# Patient Record
Sex: Female | Born: 1935 | Race: Black or African American | Hispanic: No | Smoking: Never smoker
Health system: Southern US, Community
[De-identification: ages and names within clinical notes are randomized; demographics above are authoritative.]

## PROBLEM LIST (undated history)

## (undated) DIAGNOSIS — E7800 Pure hypercholesterolemia, unspecified: Secondary | ICD-10-CM

## (undated) DIAGNOSIS — I10 Essential (primary) hypertension: Secondary | ICD-10-CM

## (undated) DIAGNOSIS — R430 Anosmia: Secondary | ICD-10-CM

## (undated) DIAGNOSIS — K219 Gastro-esophageal reflux disease without esophagitis: Secondary | ICD-10-CM

## (undated) DIAGNOSIS — C50919 Malignant neoplasm of unspecified site of unspecified female breast: Secondary | ICD-10-CM

## (undated) HISTORY — PX: CHOLECYSTECTOMY: SHX55

## (undated) HISTORY — PX: COLONOSCOPY: SHX174

## (undated) HISTORY — PX: EXCISION / BIOPSY BREAST / NIPPLE / DUCT: SUR469

## (undated) HISTORY — DX: Pure hypercholesterolemia, unspecified: E78.00

## (undated) HISTORY — DX: Essential (primary) hypertension: I10

## (undated) HISTORY — DX: Gastro-esophageal reflux disease without esophagitis: K21.9

## (undated) HISTORY — PX: APPENDECTOMY: SHX54

## (undated) HISTORY — PX: CATARACT EXTRACTION: SUR2

## (undated) HISTORY — DX: Anosmia: R43.0

---

## 1997-03-27 HISTORY — PX: ABDOMINAL HYSTERECTOMY: SHX81

## 2012-02-03 ENCOUNTER — Other Ambulatory Visit: Payer: Self-pay | Admitting: Internal Medicine

## 2012-02-03 DIAGNOSIS — R928 Other abnormal and inconclusive findings on diagnostic imaging of breast: Secondary | ICD-10-CM

## 2012-02-13 ENCOUNTER — Other Ambulatory Visit: Payer: Self-pay | Admitting: Internal Medicine

## 2012-02-13 ENCOUNTER — Ambulatory Visit
Admission: RE | Admit: 2012-02-13 | Discharge: 2012-02-13 | Disposition: A | Discharge status: Home / Self Care | Payer: Medicare Other | Source: Ambulatory Visit | Attending: Internal Medicine | Admitting: Internal Medicine

## 2012-02-13 ENCOUNTER — Other Ambulatory Visit: Payer: Self-pay | Admitting: Diagnostic Radiology

## 2012-02-13 DIAGNOSIS — R928 Other abnormal and inconclusive findings on diagnostic imaging of breast: Secondary | ICD-10-CM

## 2012-02-13 HISTORY — PX: OTHER SURGICAL HISTORY: SHX169

## 2012-02-17 ENCOUNTER — Other Ambulatory Visit: Payer: Self-pay | Admitting: Internal Medicine

## 2012-02-17 DIAGNOSIS — N63 Unspecified lump in unspecified breast: Secondary | ICD-10-CM

## 2012-02-24 ENCOUNTER — Other Ambulatory Visit: Payer: Medicare Other

## 2012-02-25 HISTORY — PX: BREAST LUMPECTOMY: SHX2

## 2012-03-01 ENCOUNTER — Telehealth (INDEPENDENT_AMBULATORY_CARE_PROVIDER_SITE_OTHER): Payer: Self-pay | Admitting: General Surgery

## 2012-03-01 ENCOUNTER — Other Ambulatory Visit (INDEPENDENT_AMBULATORY_CARE_PROVIDER_SITE_OTHER): Payer: Self-pay | Admitting: General Surgery

## 2012-03-01 ENCOUNTER — Ambulatory Visit (INDEPENDENT_AMBULATORY_CARE_PROVIDER_SITE_OTHER): Payer: Medicare Other | Admitting: General Surgery

## 2012-03-01 ENCOUNTER — Encounter (INDEPENDENT_AMBULATORY_CARE_PROVIDER_SITE_OTHER): Payer: Self-pay | Admitting: General Surgery

## 2012-03-01 VITALS — BP 150/80 | HR 76 | Temp 96.2°F | Resp 18 | Ht 62.5 in | Wt 144.6 lb

## 2012-03-01 DIAGNOSIS — C50419 Malignant neoplasm of upper-outer quadrant of unspecified female breast: Secondary | ICD-10-CM

## 2012-03-01 NOTE — Telephone Encounter (Signed)
Called and left message for Alexandra Sloan to return call. Trying to determine if this patient is in process of having her MRI breast bilateral w/wo contrast scheduled. It appears that Dr. Nedra Hai ordered the test on 02/17/12 and cancelled it on 02/19/12 pending surgical consult. The MRI should have been performed prior to appointment. Trying to determine status of appointment if any.

## 2012-03-01 NOTE — Patient Instructions (Signed)
Your recent breast x-rays and biopsies show an area of non-invasive breast cancer in the upper outer quadrant of the right breast, near the nipple. This is also called DCIS.  We have discussed treatment options for this.  You have decided that you would like to keep your breast, so you will be scheduled for a right partial mastectomy with needle localization in the near future.  Several days before that surgery, we will get an MRI of the breast to make sure that we do not see a second area of cancer. If we do find a second area we will cancel the surgery and reassess what the next steps are.     Lumpectomy, Breast Conserving Surgery A lumpectomy is breast surgery that removes only part of the breast. Another name used may be partial mastectomy. The amount removed varies. Make sure you understand how much of your breast will be removed. Reasons for a lumpectomy:  Any solid breast mass.  Grouped significant nodularity that may be confused with a solitary breast mass. Lumpectomy is the most common form of breast cancer surgery today. The surgeon removes the portion of your breast which contains the tumor (cancer). This is the lump. Some normal tissue around the lump is also removed to be sure that all the tumor has been removed.  If cancer cells are found in the margins where the breast tissue was removed, your surgeon will do more surgery to remove the remaining cancer tissue. This is called re-excision surgery. Radiation and/or chemotherapy treatments are often given following a lumpectomy to kill any cancer cells that could possibly remain.  REASONS YOU MAY NOT BE ABLE TO HAVE BREAST CONSERVING SURGERY:  The tumor is located in more than one place.  Your breast is small and the tumor is large so the breast would be disfigured.  The entire tumor removal is not successful with a lumpectomy.  You cannot commit to a full course of chemotherapy, radiation therapy or are pregnant and cannot  have radiation.  You have previously had radiation to the breast to treat cancer. HOW A LUMPECTOMY IS PERFORMED If overnight nursing is not required following a biopsy, a lumpectomy can be performed as a same-day surgery. This can be done in a hospital, clinic, or surgical center. The anesthesia used will depend on your surgeon. They will discuss this with you. A general anesthetic keeps you sleeping through the procedure. LET YOUR CAREGIVERS KNOW ABOUT THE FOLLOWING:  Allergies  Medications taken including herbs, eye drops, over the counter medications, and creams.  Use of steroids (by mouth or creams)  Previous problems with anesthetics or Novocaine.  Possibility of pregnancy, if this applies  History of blood clots (thrombophlebitis)  History of bleeding or blood problems.  Previous surgery  Other health problems BEFORE THE PROCEDURE You should be present one hour prior to your procedure unless directed otherwise.  AFTER THE PROCEDURE  After surgery, you will be taken to the recovery area where a nurse will watch and check your progress. Once you're awake, stable, and taking fluids well, barring other problems you will be allowed to go home.  Ice packs applied to your operative site may help with discomfort and keep the swelling down.  A small rubber drain may be placed in the breast for a couple of days to prevent a hematoma from developing in the breast.  A pressure dressing may be applied for 24 to 48 hours to prevent bleeding.  Keep the wound dry.  You  may resume a normal diet and activities as directed. Avoid strenuous activities affecting the arm on the side of the biopsy site such as tennis, swimming, heavy lifting (more than 10 pounds) or pulling.  Bruising in the breast is normal following this procedure.  Wearing a bra - even to bed - may be more comfortable and also help keep the dressing on.  Change dressings as directed.  Only take over-the-counter or  prescription medicines for pain, discomfort, or fever as directed by your caregiver. Call for your results as instructed by your surgeon. Remember it is your responsibility to get the results of your lumpectomy if your surgeon asked you to follow-up. Do not assume everything is fine if you have not heard from your caregiver. SEEK MEDICAL CARE IF:   There is increased bleeding (more than a small spot) from the wound.  You notice redness, swelling, or increasing pain in the wound.  Pus is coming from wound.  An unexplained oral temperature above 102 F (38.9 C) develops.  You notice a foul smell coming from the wound or dressing. SEEK IMMEDIATE MEDICAL CARE IF:   You develop a rash.  You have difficulty breathing.  You have any allergic problems. Document Released: 03/24/2006 Document Revised: 05/05/2011 Document Reviewed: 06/25/2006 [facility] Patient Information 2013 [place], [place].

## 2012-03-01 NOTE — Telephone Encounter (Signed)
Called and spoke with Marcelino Duster in the [facility] medical records department. Advised of need for the test results from 01/09/12 and 01/29/12. Send fax cover sheet to [phone] to request information to be sent asap. Confirmation of fax received. Records faxed by Marcelino Duster given to Dr. Derrell Lolling for review and will be sent to medical records to be scanned into the chart.

## 2012-03-01 NOTE — Progress Notes (Signed)
Patient ID: Alexandra Sloan, female   DOB: 08/14/1934, 78 y.o.   MRN: [id number]  Chief Complaint  Patient presents with  . New Evaluation    eval ductal carcinoma    HPI Alexandra Sloan is a 78 y.o. female.  She is referred by Dr. Lanora Manis needle at the breast center of [facility] for evaluation and management of a newly diagnosed area of DCIS in the upper outer quadrant of the right breast. Her primary care physician is Dr. [name] in [place].  This patient lives in [place]. She is in fairly good health. She has never had a breast problem before. She went for a routine screening mammograms. She thinks has been 8 years since her last mammograms. Bilateral diagnostic mammograms and ultrasound were done in [place] and read by Dr. Sharin Mons. They show a 0.9 cm. mass with irregular margins at the 10:30 position of the right breast, 2 cm from the nipple. They also show a benign cyst at the 4:00 position of the left breast 7 cm from the nipple.  Image guided biopsy of the right breast mass shows low-grade DCIS within a papilloma. ER 100%, PR 100%. MRI is pending.  Family history reveals breast cancer in her sister in her 83s. She is deceased of unknown cause. There is no other breast cancer or ovarian cancer in the family.  Comorbidities include controlled hypertension, hyperlipidemia, GERD, history of hysterectomy, cataract, and cholecystectomy. She is independent and in stable health. Her daughter is with her today. HPI  Past Medical History  Diagnosis Date  . Pure hypercholesterolemia   . Benign essential hypertension   . Esophageal reflux   . Anosmia   . Cancer     Past Surgical History  Procedure Date  . Cataract extraction   . Abdominal hysterectomy 03/1997    Family History  Problem Relation Age of Onset  . Cancer Sister     breast    Social History History  Substance Use Topics  . Smoking status: Never Smoker   . Smokeless tobacco: Never Used  .  Alcohol Use: No    No Known Allergies  Current Outpatient Prescriptions  Medication Sig Dispense Refill  . atenolol-chlorthalidone (TENORETIC) 50-25 MG per tablet Daily. Take 1/2 oral daily        Review of Systems Review of Systems  Constitutional: Negative for fever, chills and unexpected weight change.  HENT: Negative for hearing loss, congestion, sore throat, trouble swallowing and voice change.   Eyes: Negative for visual disturbance.  Respiratory: Negative for cough and wheezing.   Cardiovascular: Negative for chest pain, palpitations and leg swelling.  Gastrointestinal: Negative for nausea, vomiting, abdominal pain, diarrhea, constipation, blood in stool, abdominal distention and anal bleeding.  Genitourinary: Negative for hematuria, vaginal bleeding and difficulty urinating.  Musculoskeletal: Negative for arthralgias.  Skin: Negative for rash and wound.  Neurological: Negative for seizures, syncope and headaches.  Hematological: Negative for adenopathy. Does not bruise/bleed easily.  Psychiatric/Behavioral: Negative for confusion.    Blood pressure 150/80, pulse 76, temperature 96.2 F (35.7 C), temperature source Temporal, resp. rate 18, height 5' 2.5" (1.588 m), weight 144 lb 9.6 oz (65.59 kg).  Physical Exam Physical Exam  Constitutional: She is oriented to person, place, and time. She appears well-developed and well-nourished. No distress.  HENT:  Head: Normocephalic and atraumatic.  Nose: Nose normal.  Mouth/Throat: No oropharyngeal exudate.  Eyes: Conjunctivae normal and EOM are normal. Pupils are equal, round, and reactive to light. Left  eye exhibits no discharge. No scleral icterus.  Neck: Neck supple. No JVD present. No tracheal deviation present. No thyromegaly present.  Cardiovascular: Normal rate, regular rhythm, normal heart sounds and intact distal pulses.   No murmur heard. Pulmonary/Chest: Effort normal and breath sounds normal. No respiratory distress.  She has no wheezes. She has no rales. She exhibits no tenderness.       Breasts are medium size, recent biopsy site on right. No palpable mass. No other skin change. No  axillary adenopathy. Significant keloid transversely in presternal area.  Abdominal: Soft. Bowel sounds are normal. She exhibits no distension and no mass. There is no tenderness. There is no rebound and no guarding.       Scars from laparoscopic cholecystectomy. Lower midline scar. These scars have uneventful healing  Musculoskeletal: She exhibits no edema and no tenderness.  Lymphadenopathy:    She has no cervical adenopathy.  Neurological: She is alert and oriented to person, place, and time. She exhibits normal muscle tone. Coordination normal.  Skin: Skin is warm. No rash noted. She is not diaphoretic. No erythema. No pallor.  Psychiatric: She has a normal mood and affect. Her behavior is normal. Judgment and thought content normal.    Data Reviewed I have reviewed the imaging studies, the pathology report, the breast diagnostic profile, and office notes from [place].  Assessment    Receptor-positive DCIS right breast, upper outer quadrant, 9 mm diameter, periareolar within a papilloma.  The patient is interested in breast conservation   hypertension  Hyperlipidemia  GERD  History of cholecystectomy and hysterectomy.    Plan    I had a very long talk with the patient and her daughter. We talked about mastectomy, lymph node biopsy, lumpectomy, radiation therapy, antiestrogen therapy, MRI. At the end of the conversation it was clear that she is in favor of breast conservation and is willing to consider antiestrogen therapy and radiation therapy postop. She was offered referral for second opinion and she declined that.  We will tentatively schedule her for a right partial mastectomy with needle localization the near future.  We will schedule her for MRI preop since this is pure DCIS.  I have discussed the  indications, details, technique, and numerous risks of the surgery with her. She understands these issues. Her questions were answered. She agrees with this plan.  If the MRI shows multifocal disease we will cancel  her lumpectomy surgery and reconsider treatment plan. She is aware of this, but requested that we go ahead and tentatively schedule surgery.       Angelia Mould. Derrell Lolling, M.D., [facility] General and Minimally invasive Surgery Breast and Colorectal Surgery Office:   [phone] Pager:   [phone]  03/01/2012, 4:39 PM

## 2012-03-03 ENCOUNTER — Encounter (INDEPENDENT_AMBULATORY_CARE_PROVIDER_SITE_OTHER): Payer: Self-pay

## 2012-03-03 ENCOUNTER — Other Ambulatory Visit (INDEPENDENT_AMBULATORY_CARE_PROVIDER_SITE_OTHER): Payer: Self-pay | Admitting: General Surgery

## 2012-03-03 DIAGNOSIS — C50911 Malignant neoplasm of unspecified site of right female breast: Secondary | ICD-10-CM

## 2012-03-08 ENCOUNTER — Ambulatory Visit
Admission: RE | Admit: 2012-03-08 | Discharge: 2012-03-08 | Disposition: A | Discharge status: Home / Self Care | Payer: Medicare Other | Source: Ambulatory Visit | Attending: General Surgery | Admitting: General Surgery

## 2012-03-08 MED ORDER — GADOBENATE DIMEGLUMINE 529 MG/ML IV SOLN
13.0000 mL | Freq: Once | INTRAVENOUS | Status: AC | PRN
  Administered 2012-03-08: 13 mL via INTRAVENOUS

## 2012-03-15 ENCOUNTER — Encounter (HOSPITAL_BASED_OUTPATIENT_CLINIC_OR_DEPARTMENT_OTHER): Payer: Self-pay | Admitting: *Deleted

## 2012-03-15 ENCOUNTER — Encounter (HOSPITAL_BASED_OUTPATIENT_CLINIC_OR_DEPARTMENT_OTHER)
Admission: RE | Admit: 2012-03-15 | Discharge: 2012-03-15 | Disposition: A | Discharge status: Home / Self Care | Payer: Medicare Other | Source: Ambulatory Visit | Attending: General Surgery | Admitting: General Surgery

## 2012-03-15 ENCOUNTER — Other Ambulatory Visit: Payer: Self-pay

## 2012-03-15 LAB — BASIC METABOLIC PANEL
BUN: 18 mg/dL (ref 6–23)
CO2: 27 mEq/L (ref 19–32)
Calcium: 10.1 mg/dL (ref 8.4–10.5)
Chloride: 100 mEq/L (ref 96–112)
Creatinine, Ser: 1 mg/dL (ref 0.50–1.10)
GFR calc Af Amer: 62 mL/min — ABNORMAL LOW (ref >90)

## 2012-03-15 NOTE — Progress Notes (Signed)
To come in for [id number]  

## 2012-03-16 NOTE — H&P (Signed)
Alexandra Sloan     MRN: [id number]   Description: 78 year old female  Provider: Ernestene Mention, MD  Department: [facility]       Diagnoses     Cancer of upper-outer quadrant of female breast   - Primary    174.4         Vitals   BP Pulse Temp Resp Ht Wt    150/80 76 96.2 F (35.7 C) (Temporal) 18 5' 2.5" (1.588 m) 144 lb 9.6 oz (65.59 kg)    BMI - 26.03 kg/m2                 History and Physical     Ernestene Mention, MD   Patient ID: Alexandra Sloan, female   DOB: 1934/04/01, 78 y.o.   MRN: [id number]              HPI Alexandra Sloan is a 78 y.o. female.  She is referred by Dr. Lanora Manis Sloan at the breast center of [facility] for evaluation and management of a newly diagnosed area of DCIS in the upper outer quadrant of the right breast. Her primary care physician is Dr. [name] in [place].   This patient lives in [place]. She is in fairly good health. She has never had a breast problem before. She went for a routine screening mammograms. She thinks has been 8 years since her last mammograms. Bilateral diagnostic mammograms and ultrasound were done in [place] and read by Dr. Sharin Mons. They show a 0.9 cm. mass with irregular margins at the 10:30 position of the right breast, 2 cm from the nipple. They also show a benign cyst at the 4:00 position of the left breast 7 cm from the nipple.   Image guided biopsy of the right breast mass shows low-grade DCIS within a papilloma. ER 100%, PR 100%. MRI is pending.   Family history reveals breast cancer in her sister in her 17s. She is deceased of unknown cause. There is no other breast cancer or ovarian cancer in the family.   Comorbidities include controlled hypertension, hyperlipidemia, GERD, history of hysterectomy, cataract, and cholecystectomy. She is independent and in stable health. Her daughter is with her today.       Past Medical History   Diagnosis  Date   .  Pure  hypercholesterolemia     .  Benign essential hypertension     .  Esophageal reflux     .  Anosmia     .  Cancer         Past Surgical History   Procedure  Date   .  Cataract extraction     .  Abdominal hysterectomy  03/1997       Family History   Problem  Relation  Age of Onset   .  Cancer  Sister         breast      Social History History   Substance Use Topics   .  Smoking status:  Never Smoker    .  Smokeless tobacco:  Never Used   .  Alcohol Use:  No      No Known Allergies    Current Outpatient Prescriptions   Medication  Sig  Dispense  Refill   .  atenolol-chlorthalidone (TENORETIC) 50-25 MG per tablet  Daily. Take 1/2 oral daily            Review of Systems   Constitutional:  Negative for fever, chills and unexpected weight change.  HENT: Negative for hearing loss, congestion, sore throat, trouble swallowing and voice change.   Eyes: Negative for visual disturbance.  Respiratory: Negative for cough and wheezing.   Cardiovascular: Negative for chest pain, palpitations and leg swelling.  Gastrointestinal: Negative for nausea, vomiting, abdominal pain, diarrhea, constipation, blood in stool, abdominal distention and anal bleeding.  Genitourinary: Negative for hematuria, vaginal bleeding and difficulty urinating.  Musculoskeletal: Negative for arthralgias.  Skin: Negative for rash and wound.  Neurological: Negative for seizures, syncope and headaches.  Hematological: Negative for adenopathy. Does not bruise/bleed easily.  Psychiatric/Behavioral: Negative for confusion.    Blood pressure 150/80, pulse 76, temperature 96.2 F (35.7 C), temperature source Temporal, resp. rate 18, height 5' 2.5" (1.588 m), weight 144 lb 9.6 oz (65.59 kg).   Physical Exam   Constitutional: She is oriented to person, place, and time. She appears well-developed and well-nourished. No distress.  HENT:   Head: Normocephalic and atraumatic.   Nose: Nose normal.   Mouth/Throat:  No oropharyngeal exudate.  Eyes: Conjunctivae normal and EOM are normal. Pupils are equal, round, and reactive to light. Left eye exhibits no discharge. No scleral icterus.  Neck: Neck supple. No JVD present. No tracheal deviation present. No thyromegaly present.  Cardiovascular: Normal rate, regular rhythm, normal heart sounds and intact distal pulses.    No murmur heard. Pulmonary/Chest: Effort normal and breath sounds normal. No respiratory distress. She has no wheezes. She has no rales. She exhibits no tenderness.       Breasts are medium size, recent biopsy site on right. No palpable mass. No other skin change. No  axillary adenopathy. Significant keloid transversely in presternal area.  Abdominal: Soft. Bowel sounds are normal. She exhibits no distension and no mass. There is no tenderness. There is no rebound and no guarding.       Scars from laparoscopic cholecystectomy. Lower midline scar. These scars have uneventful healing  Musculoskeletal: She exhibits no edema and no tenderness.  Lymphadenopathy:    She has no cervical adenopathy.  Neurological: She is alert and oriented to person, place, and time. She exhibits normal muscle tone. Coordination normal.  Skin: Skin is warm. No rash noted. She is not diaphoretic. No erythema. No pallor.  Psychiatric: She has a normal mood and affect. Her behavior is normal. Judgment and thought content normal.    Data Reviewed I have reviewed the imaging studies, the pathology report, the breast diagnostic profile, and office notes from [place].   Assessment Receptor-positive DCIS right breast, upper outer quadrant, 9 mm diameter, periareolar within a papilloma.   The patient is interested in breast conservation    hypertension   Hyperlipidemia   GERD   History of cholecystectomy and hysterectomy.   Plan I had a very long talk with the patient and her daughter. We talked about mastectomy, lymph node biopsy, lumpectomy, radiation  therapy, antiestrogen therapy, MRI. At the end of the conversation it was clear that she is in favor of breast conservation and is willing to consider antiestrogen therapy and radiation therapy postop. She was offered referral for second opinion and she declined that.   We will tentatively schedule her for a right partial mastectomy with Sloan localization the near future.   We will schedule her for MRI preop since this is pure DCIS.   I have discussed the indications, details, technique, and numerous risks of the surgery with her. She understands these issues. Her questions were answered. She  agrees with this plan.   If the MRI shows multifocal disease we will cancel  her lumpectomy surgery and reconsider treatment plan. She is aware of this, but requested that we go ahead and tentatively schedule surgery.  ADDENDUM:  MRI shows solitary area of enhancemenmt, some biopsy effect makes it look larger(3.4 cm).       Angelia Mould. Derrell Lolling, M.D., [facility] General and Minimally invasive Surgery Breast and Colorectal Surgery Office:   [phone] Pager:   [phone]

## 2012-03-18 ENCOUNTER — Ambulatory Visit
Admission: RE | Admit: 2012-03-18 | Discharge: 2012-03-18 | Disposition: A | Discharge status: Home / Self Care | Payer: Medicare Other | Source: Ambulatory Visit | Attending: General Surgery | Admitting: General Surgery

## 2012-03-18 ENCOUNTER — Ambulatory Visit (HOSPITAL_BASED_OUTPATIENT_CLINIC_OR_DEPARTMENT_OTHER)
Admission: RE | Admit: 2012-03-18 | Discharge: 2012-03-18 | Disposition: A | Discharge status: Home / Self Care | Payer: Medicare Other | Source: Ambulatory Visit | Attending: General Surgery | Admitting: General Surgery

## 2012-03-18 ENCOUNTER — Ambulatory Visit (HOSPITAL_BASED_OUTPATIENT_CLINIC_OR_DEPARTMENT_OTHER): Payer: Medicare Other | Admitting: *Deleted

## 2012-03-18 ENCOUNTER — Encounter (HOSPITAL_BASED_OUTPATIENT_CLINIC_OR_DEPARTMENT_OTHER): Payer: Self-pay | Admitting: *Deleted

## 2012-03-18 ENCOUNTER — Other Ambulatory Visit (INDEPENDENT_AMBULATORY_CARE_PROVIDER_SITE_OTHER): Payer: Self-pay | Admitting: General Surgery

## 2012-03-18 ENCOUNTER — Encounter (HOSPITAL_BASED_OUTPATIENT_CLINIC_OR_DEPARTMENT_OTHER)
Admission: RE | Disposition: A | Discharge status: Home / Self Care | Payer: Self-pay | Source: Ambulatory Visit | Attending: General Surgery

## 2012-03-18 DIAGNOSIS — K219 Gastro-esophageal reflux disease without esophagitis: Secondary | ICD-10-CM | POA: Insufficient documentation

## 2012-03-18 DIAGNOSIS — C50919 Malignant neoplasm of unspecified site of unspecified female breast: Secondary | ICD-10-CM

## 2012-03-18 DIAGNOSIS — I10 Essential (primary) hypertension: Secondary | ICD-10-CM | POA: Insufficient documentation

## 2012-03-18 DIAGNOSIS — D0590 Unspecified type of carcinoma in situ of unspecified breast: Secondary | ICD-10-CM | POA: Insufficient documentation

## 2012-03-18 DIAGNOSIS — Z01810 Encounter for preprocedural cardiovascular examination: Secondary | ICD-10-CM | POA: Insufficient documentation

## 2012-03-18 DIAGNOSIS — C50419 Malignant neoplasm of upper-outer quadrant of unspecified female breast: Secondary | ICD-10-CM

## 2012-03-18 DIAGNOSIS — Z01812 Encounter for preprocedural laboratory examination: Secondary | ICD-10-CM | POA: Insufficient documentation

## 2012-03-18 DIAGNOSIS — C50911 Malignant neoplasm of unspecified site of right female breast: Secondary | ICD-10-CM

## 2012-03-18 DIAGNOSIS — E785 Hyperlipidemia, unspecified: Secondary | ICD-10-CM | POA: Insufficient documentation

## 2012-03-18 HISTORY — PX: PARTIAL MASTECTOMY WITH NEEDLE LOCALIZATION: SHX6008

## 2012-03-18 HISTORY — DX: Malignant neoplasm of unspecified site of unspecified female breast: C50.919

## 2012-03-18 SURGERY — PARTIAL MASTECTOMY WITH NEEDLE LOCALIZATION
Anesthesia: General | Site: Breast | Laterality: Right | Wound class: Clean

## 2012-03-18 MED ORDER — SODIUM CHLORIDE 0.9 % IJ SOLN: 3.0000 mL | INTRAMUSCULAR | Status: DC | PRN

## 2012-03-18 MED ORDER — ACETAMINOPHEN 650 MG RE SUPP: 650.0000 mg | RECTAL | Status: DC | PRN

## 2012-03-18 MED ORDER — SODIUM CHLORIDE 0.9 % IJ SOLN: 3.0000 mL | Freq: Two times a day (BID) | INTRAMUSCULAR | Status: DC

## 2012-03-18 MED ORDER — PROPOFOL 10 MG/ML IV BOLUS
INTRAVENOUS | Status: DC | PRN
  Administered 2012-03-18: 130 mg via INTRAVENOUS

## 2012-03-18 MED ORDER — OXYCODONE HCL 5 MG PO TABS: 5.0000 mg | ORAL_TABLET | ORAL | Status: DC | PRN

## 2012-03-18 MED ORDER — CHLORHEXIDINE GLUCONATE 4 % EX LIQD: 1.0000 "application " | Freq: Once | CUTANEOUS | Status: DC

## 2012-03-18 MED ORDER — ONDANSETRON HCL 4 MG/2ML IJ SOLN
INTRAMUSCULAR | Status: DC | PRN
  Administered 2012-03-18: 4 mg via INTRAVENOUS

## 2012-03-18 MED ORDER — BUPIVACAINE-EPINEPHRINE 0.5% -1:200000 IJ SOLN
INTRAMUSCULAR | Status: DC | PRN
  Administered 2012-03-18: 10 mL

## 2012-03-18 MED ORDER — MORPHINE SULFATE 2 MG/ML IJ SOLN: 2.0000 mg | INTRAMUSCULAR | Status: DC | PRN

## 2012-03-18 MED ORDER — LIDOCAINE-EPINEPHRINE (PF) 1 %-1:200000 IJ SOLN
INTRAMUSCULAR | Status: DC | PRN
  Administered 2012-03-18: 10 mL

## 2012-03-18 MED ORDER — ACETAMINOPHEN 325 MG PO TABS: 650.0000 mg | ORAL_TABLET | ORAL | Status: DC | PRN

## 2012-03-18 MED ORDER — CEFAZOLIN SODIUM-DEXTROSE 2-3 GM-% IV SOLR
2.0000 g | INTRAVENOUS | Status: AC
  Administered 2012-03-18: 2 g via INTRAVENOUS

## 2012-03-18 MED ORDER — EPHEDRINE SULFATE 50 MG/ML IJ SOLN
INTRAMUSCULAR | Status: DC | PRN
  Administered 2012-03-18 (×2): 10 mg via INTRAVENOUS

## 2012-03-18 MED ORDER — SODIUM CHLORIDE 0.9 % IV SOLN: 250.0000 mL | INTRAVENOUS | Status: DC | PRN

## 2012-03-18 MED ORDER — OXYCODONE HCL 5 MG/5ML PO SOLN: 5.0000 mg | Freq: Once | ORAL | Status: AC | PRN

## 2012-03-18 MED ORDER — LACTATED RINGERS IV SOLN
INTRAVENOUS | Status: DC
  Administered 2012-03-18: 12:00:00 via INTRAVENOUS

## 2012-03-18 MED ORDER — HYDROCODONE-ACETAMINOPHEN 5-325 MG PO TABS: 1.0000 | ORAL_TABLET | ORAL | Status: DC | PRN

## 2012-03-18 MED ORDER — ONDANSETRON HCL 4 MG/2ML IJ SOLN: 4.0000 mg | Freq: Four times a day (QID) | INTRAMUSCULAR | Status: DC | PRN

## 2012-03-18 MED ORDER — KETOROLAC TROMETHAMINE 15 MG/ML IJ SOLN
INTRAMUSCULAR | Status: DC | PRN
  Administered 2012-03-18: 15 mg via INTRAVENOUS

## 2012-03-18 MED ORDER — HYDROMORPHONE HCL PF 1 MG/ML IJ SOLN: 0.2500 mg | INTRAMUSCULAR | Status: DC | PRN

## 2012-03-18 MED ORDER — LIDOCAINE HCL (CARDIAC) 20 MG/ML IV SOLN
INTRAVENOUS | Status: DC | PRN
  Administered 2012-03-18: 50 mg via INTRAVENOUS

## 2012-03-18 MED ORDER — ONDANSETRON HCL 4 MG/2ML IJ SOLN: 4.0000 mg | Freq: Once | INTRAMUSCULAR | Status: DC | PRN

## 2012-03-18 MED ORDER — SODIUM CHLORIDE 0.9 % IV SOLN: INTRAVENOUS | Status: DC

## 2012-03-18 MED ORDER — FENTANYL CITRATE 0.05 MG/ML IJ SOLN
INTRAMUSCULAR | Status: DC | PRN
  Administered 2012-03-18 (×2): 50 ug via INTRAVENOUS

## 2012-03-18 MED ORDER — OXYCODONE HCL 5 MG PO TABS
5.0000 mg | ORAL_TABLET | Freq: Once | ORAL | Status: AC | PRN
  Administered 2012-03-18: 5 mg via ORAL

## 2012-03-18 SURGICAL SUPPLY — 51 items
APPLIER CLIP 9.375 MED OPEN (MISCELLANEOUS) ×2
BANDAGE ELASTIC 6 VELCRO ST LF (GAUZE/BANDAGES/DRESSINGS) IMPLANT
BENZOIN TINCTURE PRP APPL 2/3 (GAUZE/BANDAGES/DRESSINGS) IMPLANT
BINDER BREAST LRG (GAUZE/BANDAGES/DRESSINGS) ×2 IMPLANT
BLADE SURG 15 STRL LF DISP TIS (BLADE) ×2 IMPLANT
BLADE SURG 15 STRL SS (BLADE) ×2
CANISTER SUCTION 1200CC (MISCELLANEOUS) ×2 IMPLANT
CHLORAPREP W/TINT 26ML (MISCELLANEOUS) ×2 IMPLANT
CLIP APPLIE 9.375 MED OPEN (MISCELLANEOUS) ×1 IMPLANT
CLOTH BEACON ORANGE TIMEOUT ST (SAFETY) ×2 IMPLANT
COVER MAYO STAND STRL (DRAPES) ×2 IMPLANT
COVER TABLE BACK 60X90 (DRAPES) ×2 IMPLANT
DECANTER SPIKE VIAL GLASS SM (MISCELLANEOUS) IMPLANT
DERMABOND ADVANCED (GAUZE/BANDAGES/DRESSINGS)
DERMABOND ADVANCED .7 DNX12 (GAUZE/BANDAGES/DRESSINGS) IMPLANT
DEVICE DUBIN W/COMP PLATE 8390 (MISCELLANEOUS) ×2 IMPLANT
DRAPE LAPAROSCOPIC ABDOMINAL (DRAPES) IMPLANT
DRAPE LAPAROTOMY TRNSV 102X78 (DRAPE) IMPLANT
DRAPE PED LAPAROTOMY (DRAPES) ×2 IMPLANT
DRAPE UTILITY XL STRL (DRAPES) ×2 IMPLANT
ELECT REM PT RETURN 9FT ADLT (ELECTROSURGICAL) ×2
ELECTRODE REM PT RTRN 9FT ADLT (ELECTROSURGICAL) ×1 IMPLANT
GAUZE SPONGE 4X4 12PLY STRL LF (GAUZE/BANDAGES/DRESSINGS) IMPLANT
GAUZE SPONGE 4X4 16PLY XRAY LF (GAUZE/BANDAGES/DRESSINGS) IMPLANT
GLOVE EUDERMIC 7 POWDERFREE (GLOVE) ×2 IMPLANT
GOWN PREVENTION PLUS XLARGE (GOWN DISPOSABLE) ×2 IMPLANT
GOWN PREVENTION PLUS XXLARGE (GOWN DISPOSABLE) ×2 IMPLANT
KIT MARKER MARGIN INK (KITS) ×2 IMPLANT
NEEDLE HYPO 22GX1.5 SAFETY (NEEDLE) IMPLANT
NEEDLE HYPO 25X1 1.5 SAFETY (NEEDLE) ×2 IMPLANT
NS IRRIG 1000ML POUR BTL (IV SOLUTION) ×2 IMPLANT
PACK BASIN DAY SURGERY FS (CUSTOM PROCEDURE TRAY) ×2 IMPLANT
PENCIL BUTTON HOLSTER BLD 10FT (ELECTRODE) ×2 IMPLANT
SLEEVE SCD COMPRESS KNEE MED (MISCELLANEOUS) ×2 IMPLANT
SPONGE LAP 4X18 X RAY DECT (DISPOSABLE) ×2 IMPLANT
STRIP CLOSURE SKIN 1/2X4 (GAUZE/BANDAGES/DRESSINGS) IMPLANT
SUT ETHILON 4 0 PS 2 18 (SUTURE) IMPLANT
SUT MNCRL AB 4-0 PS2 18 (SUTURE) ×2 IMPLANT
SUT SILK 2 0 SH (SUTURE) ×2 IMPLANT
SUT VIC AB 2-0 SH 27 (SUTURE)
SUT VIC AB 2-0 SH 27XBRD (SUTURE) IMPLANT
SUT VIC AB 4-0 P-3 18XBRD (SUTURE) IMPLANT
SUT VIC AB 4-0 P3 18 (SUTURE)
SUT VICRYL 3-0 CR8 SH (SUTURE) ×2 IMPLANT
SYR BULB 3OZ (MISCELLANEOUS) ×6 IMPLANT
SYR CONTROL 10ML LL (SYRINGE) ×2 IMPLANT
TAPE HYPAFIX 4 X10 (GAUZE/BANDAGES/DRESSINGS) IMPLANT
TOWEL OR NON WOVEN STRL DISP B (DISPOSABLE) ×2 IMPLANT
TUBE CONNECTING 20X1/4 (TUBING) ×2 IMPLANT
WATER STERILE IRR 1000ML POUR (IV SOLUTION) ×2 IMPLANT
YANKAUER SUCT BULB TIP NO VENT (SUCTIONS) ×2 IMPLANT

## 2012-03-18 NOTE — Interval H&P Note (Signed)
History and Physical Interval Note:  03/18/2012 11:55 AM  Alexandra Sloan  has presented today for surgery, with the diagnosis of right breast cancer  The goals and the various methods of treatment have been discussed with the patient and family. After consideration of risks, benefits and other options for treatment, the patient has consented to  Procedure(s) (LRB) with comments: PARTIAL MASTECTOMY WITH NEEDLE LOCALIZATION (Right) as a surgical intervention .  The patient's history has been reviewed, patient examined today , no change in status, stable for surgery.  I have reviewed the patient's chart and labs.  Questions were answered to the patient's satisfaction.     Ernestene Mention

## 2012-03-18 NOTE — Transfer of Care (Signed)
Immediate Anesthesia Transfer of Care Note  Patient: Alexandra Sloan  Procedure(s) Performed: Procedure(s) (LRB) with comments: PARTIAL MASTECTOMY WITH NEEDLE LOCALIZATION (Right)  Patient Location: PACU  Anesthesia Type:General  Level of Consciousness: sedated  Airway & Oxygen Therapy: Patient Spontanous Breathing and Patient connected to face mask oxygen  Post-op Assessment: Report given to PACU RN and Post -op Vital signs reviewed and stable  Post vital signs: Reviewed and stable  Complications: No apparent anesthesia complications

## 2012-03-18 NOTE — Anesthesia Postprocedure Evaluation (Signed)
  Anesthesia Post-op Note  Patient: Alexandra Sloan  Procedure(s) Performed: Procedure(s) (LRB) with comments: PARTIAL MASTECTOMY WITH NEEDLE LOCALIZATION (Right)  Patient Location: PACU  Anesthesia Type:General  Level of Consciousness: awake, alert  and oriented  Airway and Oxygen Therapy: Patient Spontanous Breathing and Patient connected to face mask oxygen  Post-op Pain: mild  Post-op Assessment: Post-op Vital signs reviewed  Post-op Vital Signs: Reviewed  Complications: No apparent anesthesia complications

## 2012-03-18 NOTE — Anesthesia Preprocedure Evaluation (Addendum)
Anesthesia Evaluation  Patient identified by MRN, date of birth, ID band Patient awake    Reviewed: Allergy & Precautions, H&P , NPO status , Patient's Chart, lab work & pertinent test results, reviewed documented beta blocker date and time   Airway Mallampati: I TM Distance: >3 FB Neck ROM: Full    Dental  (+) Teeth Intact and Dental Advisory Given   Pulmonary  breath sounds clear to auscultation        Cardiovascular hypertension, Pt. on medications and Pt. on home beta blockers Rhythm:Regular Rate:Normal     Neuro/Psych    GI/Hepatic   Endo/Other    Renal/GU      Musculoskeletal   Abdominal   Peds  Hematology   Anesthesia Other Findings   Reproductive/Obstetrics                           Anesthesia Physical Anesthesia Plan  ASA: II  Anesthesia Plan: General   Post-op Pain Management:    Induction: Intravenous  Airway Management Planned: LMA  Additional Equipment:   Intra-op Plan:   Post-operative Plan: Extubation in OR  Informed Consent: I have reviewed the patients History and Physical, chart, labs and discussed the procedure including the risks, benefits and alternatives for the proposed anesthesia with the patient or authorized representative who has indicated his/her understanding and acceptance.   Dental advisory given  Plan Discussed with: CRNA and Anesthesiologist  Anesthesia Plan Comments:         Anesthesia Quick Evaluation

## 2012-03-18 NOTE — Op Note (Signed)
Patient Name:           Alexandra Sloan   Date of Surgery:        03/18/2012  Pre op Diagnosis:      Low grade DCIS right breast, receptor positive, clinical stage Tis, N0  Post op Diagnosis:    Same  Procedure:                 Right partial mastectomy with needle localization  Surgeon:                     Angelia Mould. Derrell Lolling, M.D., FACS  Assistant:                      none  Operative Indications:   Alexandra Sloan is a 78 y.o. female. She is referred by Dr. Cain Saupe at the breast center of [facility] for evaluation and management of a newly diagnosed area of DCIS in the upper outer quadrant of the right breast. Her primary care physician is Dr. [name] in [place].  This patient lives in [place]. She is in fairly good health. She has never had a breast problem before. She went for a routine screening mammograms. She thinks has been 8 years since her last mammograms. Bilateral diagnostic mammograms and ultrasound were done in [place] and read by Dr. Sharin Mons. They show a 0.9 cm. mass with irregular margins at the 10:30 position of the right breast, 2 cm from the nipple. They also show a benign cyst at the 4:00 position of the left breast 7 cm from the nipple.  Image guided biopsy of the right breast mass shows low-grade DCIS within a papilloma. ER 100%, PR 100%. MRI is pending.  MRI shows this to be a focal, a solitary abnormality. Family history reveals breast cancer in her sister in her 64s. She is deceased of unknown cause. There is no other breast cancer or ovarian cancer in the family.  Comorbidities include controlled hypertension, hyperlipidemia, GERD   Operative Findings:       The wire was inserted from the superior aspect of the breast and directed inferiorly behind the lateral areolar margin. The wire was placed superficial to the marker clip and tumor and so we had to take a moderate amount of tissue deep to the localizing wire. We took the tissue all the way  down to the pectoralis fascia since the breast was small.  The wire tip was barely visible at the inferior aspect of the specimen and so I took an additional inferior margin. The specimen mammogram actually looked very good with the marker clip in the center of the specimen.  Procedure in Detail:          The patient underwent wire localization at the breast center of [facility]. She is brought to the operating room at [facility] where general anesthesia was induced with an LMA device. The right breast was prepped and draped in a sterile fashion. Intravenous antibiotics were given. Surgical time out was performed. 0.5% Marcaine with epinephrine was used as local infiltration anesthetic. A curvilinear incision was made laterally in the right breast. This was about 1 cm lateral to the lateral areolar margin. Dissection was carried down into the breast tissue and widely around the localizing wire. We took an additional inferior margin. The lumpectomy cavity was marked with a metal marker clips. The wound was irrigated with saline. Hemostasis was excellent and achieved electrocautery.  Specimen mammogram looked good as described above. The breast tissues were reapproximated with multiple interrupted sutures of 3-0 Vicryl and the skin closed with a running subcuticular suture of 4-0 Monocryl and Dermabond. The patient tolerated the procedure well and was taken to recovery in stable condition. There were no complications. EBL 10 cc. Counts correct.     Angelia Mould. Derrell Lolling, M.D., FACS General and Minimally Invasive Surgery Breast and Colorectal Surgery  03/18/2012 1:06 PM

## 2012-03-18 NOTE — Anesthesia Procedure Notes (Signed)
Procedure Name: LMA Insertion Date/Time: 03/18/2012 12:15 PM Performed by: Caren Macadam Pre-anesthesia Checklist: Patient identified, Emergency Drugs available, Suction available and Patient being monitored Patient Re-evaluated:Patient Re-evaluated prior to inductionOxygen Delivery Method: Circle System Utilized Preoxygenation: Pre-oxygenation with 100% oxygen Intubation Type: IV induction Ventilation: Mask ventilation without difficulty LMA: LMA inserted LMA Size: 4.0 Number of attempts: 1 Airway Equipment and Method: bite block Placement Confirmation: positive ETCO2 and breath sounds checked- equal and bilateral Tube secured with: Tape Dental Injury: Teeth and Oropharynx as per pre-operative assessment

## 2012-03-19 ENCOUNTER — Encounter (HOSPITAL_BASED_OUTPATIENT_CLINIC_OR_DEPARTMENT_OTHER): Payer: Self-pay | Admitting: General Surgery

## 2012-03-19 ENCOUNTER — Telehealth (INDEPENDENT_AMBULATORY_CARE_PROVIDER_SITE_OTHER): Payer: Self-pay | Admitting: General Surgery

## 2012-03-19 NOTE — Telephone Encounter (Signed)
Called patient no answer machine no one picked up phone . I also tried daughter Albin Felling unable to reach her to leave appt .

## 2012-03-19 NOTE — Addendum Note (Signed)
Addendum  created 03/19/12 1210 by Lance Coon, CRNA   Modules edited:Charges VN

## 2012-03-22 ENCOUNTER — Telehealth (INDEPENDENT_AMBULATORY_CARE_PROVIDER_SITE_OTHER): Payer: Self-pay | Admitting: General Surgery

## 2012-03-22 NOTE — Telephone Encounter (Signed)
Spoke with patient  She has f/u post op for 04/12/12 at 8:45am . She states she is doing fine.

## 2012-03-22 NOTE — Progress Notes (Signed)
Quick Note:  Inform patient of Pathology report,. Tell her that we did not find any more cancer, just atypical hyperplasia. I will discuss further at next visit. She does need to be treated for DCIS and needs to be referred to medical oncology and radiation oncology, if not already done. ______

## 2012-03-23 ENCOUNTER — Telehealth (INDEPENDENT_AMBULATORY_CARE_PROVIDER_SITE_OTHER): Payer: Self-pay | Admitting: General Surgery

## 2012-03-23 NOTE — Telephone Encounter (Signed)
Called patient and advised of pathology report per Dr. Jacinto Halim request: " Tell her that we did not find any more cancer, just atypical hyperplasia. I will discuss further at next visit. She does need to be treated for DCIS and needs to be referred to medical oncology and radiation oncology."   Confirmed post op appointment to see Dr. Derrell Lolling on 04/12/12 at 8:45. Patient already referred to oncology and radiation oncology. Advised the patient she will be contacted by those respective departments for appointments. Patient agreed.

## 2012-03-31 ENCOUNTER — Telehealth: Payer: Self-pay | Admitting: *Deleted

## 2012-03-31 ENCOUNTER — Encounter: Payer: Self-pay | Admitting: *Deleted

## 2012-03-31 ENCOUNTER — Encounter: Payer: Self-pay | Admitting: Radiation Oncology

## 2012-03-31 ENCOUNTER — Ambulatory Visit
Admission: RE | Admit: 2012-03-31 | Discharge: 2012-03-31 | Disposition: A | Discharge status: Home / Self Care | Payer: Medicare Other | Source: Ambulatory Visit | Attending: Radiation Oncology | Admitting: Radiation Oncology

## 2012-03-31 VITALS — BP 140/66 | HR 59 | Temp 97.9°F | Ht 62.5 in | Wt 141.1 lb

## 2012-03-31 DIAGNOSIS — D0590 Unspecified type of carcinoma in situ of unspecified breast: Secondary | ICD-10-CM | POA: Insufficient documentation

## 2012-03-31 DIAGNOSIS — C50419 Malignant neoplasm of upper-outer quadrant of unspecified female breast: Secondary | ICD-10-CM

## 2012-03-31 HISTORY — DX: Malignant neoplasm of unspecified site of unspecified female breast: C50.919

## 2012-03-31 NOTE — Addendum Note (Signed)
Encounter addended by: Delynn Flavin, RN on: 03/31/2012  5:44 PM<BR>     Documentation filed: Charges VN

## 2012-03-31 NOTE — Telephone Encounter (Signed)
No Answer, No Machine

## 2012-03-31 NOTE — Progress Notes (Signed)
Denies any pain, but states she is sore.  Her incision is intact with surgical glue above the areola region.  She denies any fatigue and has only taken her pain medication once post surgery.  She has a large mid-chest keloid.  She lives alone but  her son lives in [place].

## 2012-03-31 NOTE — Progress Notes (Signed)
Saw that the pt is scheduled to come see Dr. Basilio Cairo today so I called Clydie Braun and requested for Clydie Braun to please have pt to come see me.  She suggested that I bring the appt information to her and she would give to the patient and if there was a problem with the appt then the pt could call or stop by, so I scheduled the pt to see Dr. Welton Flakes on 04/06/12 and took appt card, new pt letter & packet to Clydie Braun to give to pt.  Took paperwork to Med Rec for chart.

## 2012-03-31 NOTE — Progress Notes (Signed)
Here today for a discussion of Radiation Therapy for treatment of her Breast Cancer

## 2012-03-31 NOTE — Progress Notes (Addendum)
Radiation Oncology         [phone] ________________________________  Initial outpatient Consultation  Name: Alexandra Sloan MRN: [id number]  Date: 03/31/2012  DOB: 10-25-1934  CC:LEE,KEUNG, MD  Ernestene Mention, MD  Drue Second  REFERRING PHYSICIAN: Ernestene Mention, MD  DIAGNOSIS: ER+ PR+ Low Grade DCIS, right breast  HISTORY OF PRESENT ILLNESS::Alexandra Sloan is a 78 y.o. female who was recently found on screening mammography to have a nodule in the right breast on screening mammogram in December. She denies any prior breast biopsies before this abnormal mammogram. This abnormality was in the upper outer quadrant. She underwent ultrasound-guided core needle biopsy on 02/19/2012. That demonstrated DCIS with calcifications, involving a papilloma.  It is estimated the DCIS was 0.3 cm and was low grade with no necrosis. The DCIS was 100% ER and PR positive. She underwent MRI of her breasts on 03/08/2012. This demonstrated a 3.9 x 1.4 x 1.3 cm area of triangular shaped and linear low grade enhancement in the outer right breast, slightly superiorly, most consistent with post biopsy enhancement and hemorrhage. There is no evidence of malignancy elsewhere in either breast and no adenopathy.  She underwent lumpectomy on 03/18/2012. The specimen demonstrated atypical ductal hyperplasia but no residual malignancy. She is scheduled to see medical oncology next week. She lives in [place], approximately 45 minutes from this clinic.  She reports that she had a hysterectomy performed in [place] around 1999. She believes it was for uterine cancer. She believes her ovaries were removed as well. She has had no evidence of disease since then. She denies any adjuvant therapies after her hysterectomy and bilateral salpingo-oophorectomy. She denies any history of hormone replacement therapy.  PREVIOUS RADIATION THERAPY: No  PAST MEDICAL HISTORY:  has a past medical history of Pure  hypercholesterolemia; Benign essential hypertension; Esophageal reflux; Anosmia; and Breast cancer (03/18/2012).    PAST SURGICAL HISTORY: Past Surgical History  Procedure Date  . Cataract extraction   . Abdominal hysterectomy 03/1997  . Cholecystectomy   . Appendectomy   . Colonoscopy   . Partial mastectomy with needle localization 03/18/2012    Procedure: PARTIAL MASTECTOMY WITH NEEDLE LOCALIZATION;  Surgeon: Ernestene Mention, MD;  Location: [facility];  Service: General;  Laterality: Right;  . Breast, needle core biopsy 02/13/2012    Ductal Carcinoma In-Situ/ Upper Outer Quadrant    FAMILY HISTORY: family history includes Cancer in her sister. her sister was  diagnosed with breast cancer, approximately in her early 30s. Her sister died about a decade later. The patient is not sure if it was from her breast cancer. The patient denies any ovarian cancer in her family. No other family members had breast cancer  SOCIAL HISTORY:  reports that she has never smoked. She has never used smokeless tobacco. She reports that she does not drink alcohol or use illicit drugs.  ALLERGIES: Review of patient's allergies indicates no known allergies.  MEDICATIONS:  Current Outpatient Prescriptions  Medication Sig Dispense Refill  . atenolol-chlorthalidone (TENORETIC) 50-25 MG per tablet Daily. Take 1/2 oral daily      . HYDROcodone-acetaminophen (NORCO/VICODIN) 5-325 MG per tablet Take 1-2 tablets by mouth every 4 (four) hours as needed for pain.  30 tablet  1    REVIEW OF SYSTEMS:    Pertinent items are noted in HPI.   PHYSICAL EXAM: Blood pressure 140/66 pulse 59 weight 64 kg height 158.8 cm General: Alert and oriented, in no acute distress. Well-appearing, appears  slightly younger than her stated age HEENT: Head is normocephalic. Pupils are equally round and reactive to light. Extraocular movements are intact. Oropharynx is clear. Poor dentition Heart: Regular in rate and rhythm  with no murmurs, rubs, or gallops. Chest: Clear to auscultation bilaterally, with no rhonchi, wheezes, or rales. Abdomen: Soft, nontender, nondistended, with no rigidity or guarding. Extremities: No cyanosis or edema. Skin: No concerning lesions. Musculoskeletal: symmetric strength and muscle tone throughout. Neurologic: Cranial nerves II through XII are grossly intact. No obvious focalities. Speech is fluent. Coordination is intact. Psychiatric: Judgment and insight are intact. Affect is appropriate. Breasts: Lumpectomy scar healing in the outer right breast. Right breast is still sore. No palpable lesions of concern in either breast. No palpable axillary adenopathy bilaterally.   LABORATORY DATA:  No results found for this basename: WBC,  HGB,  HCT,  MCV,  PLT   CMP     Component Value Date/Time   NA 140 03/15/2012 1530   K 3.0* 03/15/2012 1530   CL 100 03/15/2012 1530   CO2 27 03/15/2012 1530   GLUCOSE 114* 03/15/2012 1530   BUN 18 03/15/2012 1530   CREATININE 1.00 03/15/2012 1530   CALCIUM 10.1 03/15/2012 1530   GFRNONAA 53* 03/15/2012 1530   GFRAA 62* 03/15/2012 1530        RADIOGRAPHY: As above  IMPRESSION/PLAN: This is a very pleasant 78 year old woman status post lumpectomy for DCIS. The lumpectomy revealed no residual disease. All of the disease had been removed at biopsy. The disease was a 0.3 cm focus, low grade, and no necrosis. It was ER and PR positive by 100%.  Her prognostic factors are very good. I think her disease is very low risk. I think it is reasonable for her to undergo observation and I do not think that radiotherapy is absolutely necessary. However, I did discuss the logistics of radiotherapy in case she is interested in minimizing her risk of an in breast recurrence. Radiotherapy could be completed in about 16 treatments. After a lengthy discussion, the patient feels most comfortable undergoing observation alone. I think this is very appropriate. I did encourage her  to meet with medical oncology as scheduled. I will see her back on an as-needed basis. I underscored the importance of continuing regular mammography and followup with her surgeon.   I spent 30 minutes face to face with the patient and more than 50% of that time was spent in counseling and/or coordination of care.   __________________________________________   Lonie Peak, MD

## 2012-04-06 ENCOUNTER — Other Ambulatory Visit: Payer: Self-pay | Admitting: Emergency Medicine

## 2012-04-06 ENCOUNTER — Ambulatory Visit (HOSPITAL_BASED_OUTPATIENT_CLINIC_OR_DEPARTMENT_OTHER): Payer: Medicare Other | Admitting: Oncology

## 2012-04-06 ENCOUNTER — Ambulatory Visit: Payer: Medicare Other

## 2012-04-06 ENCOUNTER — Encounter: Payer: Self-pay | Admitting: Oncology

## 2012-04-06 ENCOUNTER — Other Ambulatory Visit (HOSPITAL_BASED_OUTPATIENT_CLINIC_OR_DEPARTMENT_OTHER): Payer: Medicare Other | Admitting: Lab

## 2012-04-06 VITALS — BP 147/77 | HR 56 | Temp 97.8°F | Resp 20 | Ht 62.0 in | Wt 141.7 lb

## 2012-04-06 DIAGNOSIS — Z170 Estrogen receptor positive status [ER+]: Secondary | ICD-10-CM

## 2012-04-06 DIAGNOSIS — D0590 Unspecified type of carcinoma in situ of unspecified breast: Secondary | ICD-10-CM

## 2012-04-06 DIAGNOSIS — C50419 Malignant neoplasm of upper-outer quadrant of unspecified female breast: Secondary | ICD-10-CM

## 2012-04-06 LAB — CBC WITH DIFFERENTIAL/PLATELET
BASO%: 1.2 % (ref 0.0–2.0)
Eosinophils Absolute: 0.2 10*3/uL (ref 0.0–0.5)
HCT: 39.9 % (ref 34.8–46.6)
LYMPH%: 42.3 % (ref 14.0–49.7)
MCHC: 34.1 g/dL (ref 31.5–36.0)
MONO#: 0.6 10*3/uL (ref 0.1–0.9)
NEUT#: 3 10*3/uL (ref 1.5–6.5)
NEUT%: 45.2 % (ref 38.4–76.8)
Platelets: 220 10*3/uL (ref 145–400)
RBC: 4.34 10*6/uL (ref 3.70–5.45)
WBC: 6.7 10*3/uL (ref 3.9–10.3)
lymph#: 2.8 10*3/uL (ref 0.9–3.3)

## 2012-04-06 LAB — COMPREHENSIVE METABOLIC PANEL (CC13)
ALT: 19 U/L (ref 0–55)
Albumin: 4.2 g/dL (ref 3.5–5.0)
CO2: 29 mEq/L (ref 22–29)
Calcium: 10.2 mg/dL (ref 8.4–10.4)
Chloride: 102 mEq/L (ref 98–107)
Glucose: 90 mg/dl (ref 70–99)
Sodium: 140 mEq/L (ref 136–145)
Total Protein: 7.8 g/dL (ref 6.4–8.3)

## 2012-04-06 MED ORDER — TAMOXIFEN CITRATE 20 MG PO TABS: 20.0000 mg | ORAL_TABLET | Freq: Every day | ORAL | Status: DC

## 2012-04-06 NOTE — Progress Notes (Signed)
Alexandra Sloan [id number] 10-09-1934 78 y.o. 04/06/2012 11:35 AM  CC [name], MD [address] [place] [address] Dr. Claud Kelp Dr. 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 Patient was seen in the  Breast Clinic for discussion of her treatment options.  STAGE:  Right breast DCIS. Low grade ER+/PR+   REFERRING PHYSICIAN: Dr. Claud Kelp  HISTORY OF PRESENT ILLNESS:  Alexandra Sloan is a 78 y.o. female.  who was recently found on screening mammography to have a nodule in the right breast on screening mammogram in December. She denies any prior breast biopsies before this abnormal mammogram. This abnormality was in the upper outer quadrant. She underwent ultrasound-guided core needle biopsy on 02/19/2012. That demonstrated DCIS with calcifications, involving a papilloma. It is estimated the DCIS was 0.3 cm and was low grade with no necrosis. The DCIS was 100% ER and PR positive. She underwent MRI of her breasts on 03/08/2012. This demonstrated a 3.9 x 1.4 x 1.3 cm area of triangular shaped and linear low grade enhancement in the outer right breast, slightly superiorly, most consistent with post biopsy enhancement and hemorrhage. There is no evidence of malignancy elsewhere in either breast and no adenopathy.  She underwent lumpectomy on 03/18/2012. The specimen demonstrated atypical ductal hyperplasia but no residual malignancy   Past Medical History: Past Medical History  Diagnosis Date  . Pure hypercholesterolemia   . Benign essential hypertension   . Esophageal reflux   . Anosmia     Loss of smell  . Breast cancer 03/18/2012    Right Breast    Past Surgical History: Past Surgical History  Procedure Laterality Date  . Cataract extraction    . Abdominal hysterectomy  03/1997  . Cholecystectomy    . Appendectomy    . Colonoscopy    . Partial  mastectomy with needle localization  03/18/2012    Procedure: PARTIAL MASTECTOMY WITH NEEDLE LOCALIZATION;  Surgeon: Ernestene Mention, MD;  Location: [facility];  Service: General;  Laterality: Right;  . Breast, needle core biopsy  02/13/2012    Ductal Carcinoma In-Situ/ Upper Outer Quadrant    Family History: Family History  Problem Relation Age of Onset  . Cancer Sister     breast    Social History History  Substance Use Topics  . Smoking status: Never Smoker   . Smokeless tobacco: Never Used  . Alcohol Use: No    Allergies: No Known Allergies  Current Medications: Current Outpatient Prescriptions  Medication Sig Dispense Refill  . atenolol-chlorthalidone (TENORETIC) 50-25 MG per tablet Daily. Take 1/2 oral daily      . HYDROcodone-acetaminophen (NORCO/VICODIN) 5-325 MG per tablet Take 1-2 tablets by mouth every 4 (four) hours as needed for pain.  30 tablet  1   No current facility-administered medications for this visit.    OB/GYN History: menarche at 65, menopause at 59, no HRT G4P4, first child at 69 years  Fertility Discussion: N/A Prior History of Cancer: hystectomy for cancer of the uterus  Health Maintenance:  Colonoscopy yes in 2008 Bone Density none Last PAP smear unknown  ECOG PERFORMANCE STATUS: 0 - Asymptomatic  Genetic Counseling/testing: no need for genetic counseling or testing  REVIEW OF SYSTEMS:  A comprehensive review of systems was negative.  PHYSICAL EXAMINATION: Blood pressure 147/77, pulse 56, temperature 97.8 F (36.6 C), temperature source Oral, resp. rate 20, height  5\' 2"  (1.575 m), weight 141 lb 11.2 oz (64.275 kg).  ZOX:WRUEA, healthy, no distress, well nourished and well developed SKIN: skin color, texture, turgor are normal HEAD: Normocephalic EYES: PERRLA, EOMI EARS: External ears normal OROPHARYNX:no exudate and no erythema  NECK: supple, no adenopathy LYMPH:  no palpable lymphadenopathy, no  hepatosplenomegaly BREAST: Left breast normal without mass, skin or nipple changes or axillary nodes, surgical scars noted periareolar is healing well in the right breast LUNGS: clear to auscultation  HEART: regular rate & rhythm ABDOMEN:abdomen soft, non-tender, normal bowel sounds and no masses or organomegaly BACK: No CVA tenderness EXTREMITIES:no edema, no clubbing, no cyanosis  NEURO: alert & oriented x 3 with fluent speech, no focal motor/sensory deficits, gait normal     STUDIES/RESULTS: Mr Breast Bilateral W Wo Contrast  03/08/2012  *RADIOLOGY REPORT*  Clinical Data: Recently diagnosed low grade ductal carcinoma in situ in a papilloma in the 10:30 o'clock position of the right breast.  BUN and creatinine were obtained on site at [facility] at [address] Results:  BUN 12 mg/dL,  Creatinine 1.1 mg/dL.  BILATERAL BREAST MRI WITH AND WITHOUT CONTRAST  Technique: Multiplanar, multisequence MR images of both breasts were obtained prior to and following the intravenous administration of 13ml of MultiHance.  Three dimensional images were evaluated at the independent DynaCad workstation.  Comparison:  Recent mammograms, ultrasound and biopsy examinations.  Findings: Mild background parenchymal enhancement in both breasts.  Elongated, triangular and linear area of low grade enhancement with persistent kinetics in the anterior aspect of the outer right breast, slightly superiorly.  This contains a biopsy marker clip artifact.  This corresponds to the location of the recent biopsy. There is evidence of post biopsy hemorrhage within this portion of the breast on the post clip placement mammograms dated 02/13/2012 at the [facility], with no abnormal density in this region on the initial screening mammogram dated 01/09/2012 at [facility]  No additional masses or areas of enhancement suspicious for malignancy.  No abnormal appearing lymph nodes.  There are  five small, rounded, oval and kidney bean shaped masses in the included portions of the liver.  These are smoothly marginated and homogeneously hyperintense on the T2-weighted and inversion recovery images.  Enhancement cannot be assessed due to cardiac motion artifacts crossing these portions of the liver on the postcontrast images.  The largest of these measures 1.3 cm in maximum diameter on inversion recovery image number 50, in the medial segment of the left lobe of the liver.  There is mild ectasia of the superior aspect of the ascending thoracic aorta, measuring 3.7 cm in maximum transverse diameter.  IMPRESSION:  1. 3.9 x 1.4 x 1.1 cm area of triangular shaped and linear low grade enhancement in the outer right breast, slightly superiorly, including the area of recently biopsied low grade ductal carcinoma in situ in a papilloma.  This most likely represents post biopsy enhancement associated with post biopsy hemorrhage within the breast seen on the post clip placement mammogram images.  It is unlikely that this represents additional ductal carcinoma in situ.  2.  No evidence of malignancy elsewhere in either breast and no adenopathy. 3.  Probable liver cysts or hemangiomata. 4.  Mild ectasia of the ascending thoracic aorta without aneurysm.  RECOMMENDATION: Treatment plan  THREE-DIMENSIONAL MR IMAGE RENDERING ON INDEPENDENT WORKSTATION:  Three-dimensional MR images were rendered by post-processing of the original MR data on an independent workstation.  The three- dimensional MR  images were interpreted, and findings were reported in the accompanying complete MRI report for this study.  BI-RADS CATEGORY 6:  Known biopsy-proven malignancy - appropriate action should be taken.   Original Report Authenticated By: Beckie Salts, M.D.    Mm Plc Breast Loc Dev   1st Lesion  Inc Mammo Guide  03/18/2012  *RADIOLOGY REPORT*  Clinical Data:  right breast cancer  NEEDLE LOCALIZATION WITH MAMMOGRAPHIC GUIDANCE AND  SPECIMEN RADIOGRAPH  Comparison:  Previous exams.  Patient presents for needle localization prior to lumpectomy.  I met with the patient and we discussed the procedure of needle localization including benefits and alternatives. We discussed the high likelihood of a successful procedure. We discussed the risks of the procedure, including infection, bleeding, tissue injury, and further surgery. Informed, written consent was given.  Using mammographic guidance, sterile technique, 2% lidocaine and a 7cm modified Kopans needle, the marker clip was localized using a craniocaudal approach.  The films are marked for Dr. Derrell Lolling.  Specimen radiograph was performed at Day Surgery, and confirms marker clip and wire present in the tissue sample.  The specimen is marked for pathology.  IMPRESSION: Needle localization right breast.  No apparent complications.   Original Report Authenticated By: Esperanza Heir, M.D.      LABS:    Chemistry      Component Value Date/Time   NA 140 04/06/2012 1025   NA 140 03/15/2012 1530   K 3.4* 04/06/2012 1025   K 3.0* 03/15/2012 1530   CL 102 04/06/2012 1025   CL 100 03/15/2012 1530   CO2 29 04/06/2012 1025   CO2 27 03/15/2012 1530   BUN 15.0 04/06/2012 1025   BUN 18 03/15/2012 1530   CREATININE 1.0 04/06/2012 1025   CREATININE 1.00 03/15/2012 1530      Component Value Date/Time   CALCIUM 10.2 04/06/2012 1025   CALCIUM 10.1 03/15/2012 1530   ALKPHOS 96 04/06/2012 1025   AST 21 04/06/2012 1025   ALT 19 04/06/2012 1025   BILITOT 0.51 04/06/2012 1025      Lab Results  Component Value Date   WBC 6.7 04/06/2012   HGB 13.6 04/06/2012   HCT 39.9 04/06/2012   MCV 91.9 04/06/2012   PLT 220 04/06/2012   PATHOLOGY: Diagnosis 1. Breast, lumpectomy, Right - ATYPICAL DUCTAL HYPERPLASIA. - FIBROCYSTIC CHANGES WITH ADENOSIS AND CALCIFICATIONS. - HEALING BIOPSY SITE. - SEE ONCOLOGY TABLE BELOW. 2. Breast, excision, Right inferior margin - FIBROCYSTIC CHANGES. - THERE IS NO EVIDENCE OF  MALIGNANCY. - SEE COMMENT. Microscopic Comment 1. BREAST, IN SITU CARCINOMA (Based on case [id number]) Specimen, including laterality: Right breast Procedure: Needle core biopsy followed by lumpectomy Grade of carcinoma: Low grade Necrosis: Not identified Estimated tumor size: (glass slide measurement): 0.3 cm Treatment effect: N/A Distance to closest margin: Greater than 2.0 cm to all margins Breast prognostic profile: Case [id number] Estrogen receptor: 100%, strong staining intensity Progesterone receptor: 100%, strong staining intensity Lymph nodes: None examined TNM: pTis, pNX Comments: The current lumpectomy specimen contains a focus of atypical ductal hyperplasia. This focus is quantitatively less than the low grade ductal carcinoma in situ present in the patient's initial needle core biopsy ([id number]). The estimated glass slide measurement is based on this previous core biopsy. (JBK:kh 03-22-12) 2. The surgical resection margins of the specimen were inked and microscopically evaluated.   ASSESSMENT    78 year old female with  #1 new diagnosis of low-grade ductal carcinoma in situ measuring 0.3 cm ER +100% PR positive. Patient is now status  post lumpectomy on 03/18/2012. Her final pathology revealed atypical ductal hyperplasia but no residual malignancy. Postoperatively patient is doing well.  #2 patient was seen by radiation oncology at this time they do not feel that patient will benefit from post surgical adjuvant radiation therapy to the right breast.  #3 patient and I discussed role of chemoprevention with tamoxifen today. I do think that overall patient is very healthy without any significant medical issues. We discussed risks and benefits of tamoxifen. We certainly could try this at 20 mg on a daily basis. She is agreeable to it. We discussed the side effects.  Clinical Trial Eligibility: NSABP B-43 Multidisciplinary conference discussion yes     PLAN:     #1 patient will proceed with tamoxifen 20 mg daily. Prescription was sent to her pharmacy.  #2 I will plan on seeing her back in 3 months time or sooner if need arises.       Discussion: Patient is being treated per NCCN breast cancer care guidelines appropriate for stage.0   Thank you so much for allowing me to participate in the care of Alexandra Sloan. I will continue to follow up the patient with you and assist in her care.  All questions were answered. The patient knows to call the clinic with any problems, questions or concerns. We can certainly see the patient much sooner if necessary.  I spent 55 minutes counseling the patient face to face. The total time spent in the appointment was 60 minutes.   Drue Second, MD Medical/Oncology [facility] [phone] (beeper) [phone] (Office)  04/06/2012, 11:35 AM

## 2012-04-06 NOTE — Patient Instructions (Addendum)
We discussed your diagnosis of DCIS  We discussed prevention for future breast cancer with tamoxifen  We discussed side effects of tamoxifen  I have sent the prescription to [place] [facility] for you to pick up and begin taking every day in the morning at the same time  Tamoxifen oral tablet What is this medicine? TAMOXIFEN (ta MOX i fen) blocks the effects of estrogen. It is commonly used to treat breast cancer. It is also used to decrease the chance of breast cancer coming back in women who have received treatment for the disease. It may also help prevent breast cancer in women who have a high risk of developing breast cancer. This medicine may be used for other purposes; ask your health care provider or pharmacist if you have questions. What should I tell my health care provider before I take this medicine? They need to know if you have any of these conditions: -blood clots -blood disease -cataracts or impaired eyesight -endometriosis -high calcium levels -high cholesterol -irregular menstrual cycles -liver disease -stroke -uterine fibroids -an unusual or allergic reaction to tamoxifen, other medicines, foods, dyes, or preservatives -pregnant or trying to get pregnant -breast-feeding How should I use this medicine? Take this medicine by mouth with a glass of water. Follow the directions on the prescription label. You can take it with or without food. Take your medicine at regular intervals. Do not take your medicine more often than directed. Do not stop taking except on your doctor's advice. A special MedGuide will be given to you by the pharmacist with each prescription and refill. Be sure to read this information carefully each time. Talk to your pediatrician regarding the use of this medicine in children. While this drug may be prescribed for selected conditions, precautions do apply. Overdosage: If you think you have taken too much of this medicine contact a poison control  center or emergency room at once. NOTE: This medicine is only for you. Do not share this medicine with others. What if I miss a dose? If you miss a dose, take it as soon as you can. If it is almost time for your next dose, take only that dose. Do not take double or extra doses. What may interact with this medicine? -aminoglutethimide -bromocriptine -chemotherapy drugs -female hormones, like estrogens and birth control pills -letrozole -medroxyprogesterone -phenobarbital -rifampin -warfarin This list may not describe all possible interactions. Give your health care provider a list of all the medicines, herbs, non-prescription drugs, or dietary supplements you use. Also tell them if you smoke, drink alcohol, or use illegal drugs. Some items may interact with your medicine. What should I watch for while using this medicine? Visit your doctor or health care professional for regular checks on your progress. You will need regular pelvic exams, breast exams, and mammograms. If you are taking this medicine to reduce your risk of getting breast cancer, you should know that this medicine does not prevent all types of breast cancer. If breast cancer or other problems occur, there is no guarantee that it will be found at an early stage. Do not become pregnant while taking this medicine or for 2 months after stopping this medicine. Stop taking this medicine if you get pregnant or think you are pregnant and contact your doctor. This medicine may harm your unborn baby. Women who can possibly become pregnant should use birth control methods that do not use hormones during tamoxifen treatment and for 2 months after therapy has stopped. Talk with your health  care provider for birth control advice. Do not breast feed while taking this medicine. What side effects may I notice from receiving this medicine? Side effects that you should report to your doctor or health care professional as soon as possible: -changes in  vision (blurred vision) -changes in your menstrual cycle -difficulty breathing or shortness of breath -difficulty walking or talking -new breast lumps -numbness -pelvic pain or pressure -redness, blistering, peeling or loosening of the skin, including inside the mouth -skin rash or itching (hives) -sudden chest pain -swelling of lips, face, or tongue -swelling, pain or tenderness in your calf or leg -unusual bruising or bleeding -vaginal discharge that is bloody, brown, or rust -weakness -yellowing of the whites of the eyes or skin Side effects that usually do not require medical attention (report to your doctor or health care professional if they continue or are bothersome): -fatigue -hair loss, although uncommon and is usually mild -headache -hot flashes -impotence (in men) -nausea, vomiting (mild) -vaginal discharge (white or clear) This list may not describe all possible side effects. Call your doctor for medical advice about side effects. You may report side effects to FDA at 1-800-FDA-1088. Where should I keep my medicine? Keep out of the reach of children. Store at room temperature between 20 and 25 degrees C (68 and 77 degrees F). Protect from light. Keep container tightly closed. Throw away any unused medicine after the expiration date. NOTE: This sheet is a summary. It may not cover all possible information. If you have questions about this medicine, talk to your doctor, pharmacist, or health care provider.  2012, [employer]vier/Gold Standard. (10/28/2007 12:01:56 PM)

## 2012-04-12 ENCOUNTER — Encounter (INDEPENDENT_AMBULATORY_CARE_PROVIDER_SITE_OTHER): Payer: Self-pay | Admitting: General Surgery

## 2012-04-12 ENCOUNTER — Ambulatory Visit (INDEPENDENT_AMBULATORY_CARE_PROVIDER_SITE_OTHER): Payer: Medicare Other | Admitting: General Surgery

## 2012-04-12 VITALS — BP 144/88 | HR 60 | Temp 97.2°F | Resp 14 | Ht 62.0 in | Wt 140.6 lb

## 2012-04-12 DIAGNOSIS — C50419 Malignant neoplasm of upper-outer quadrant of unspecified female breast: Secondary | ICD-10-CM

## 2012-04-12 NOTE — Patient Instructions (Signed)
Your right breast lumpectomy site is healing without any obvious surgical complications. The clear plastic glue will wear off in another week or 2.  There are no restrictions on her clothing or your activities.  Return to see Dr. Derrell Lolling in 5 months.

## 2012-04-12 NOTE — Progress Notes (Signed)
Patient ID: Alexandra Sloan, female   DOB: 07-08-1934, 78 y.o.   MRN: [id number] History: This patient underwent right partial mastectomy with needle localization on 03/18/2012. Preoperative image guided biopsy showed low grade DCIS, strongly receptor positive. Final pathology on the lumpectomy specimen showed atypical ductal hyperplasia, negative margins.  Final pathologic stage Tis, Nx. She has seen Dr. Drue Second and has been started on tamoxifen. She has seen Dr. Lonie Peak, and considering the antiestrogen therapy, it was felt that she would not benefit further from radiation therapy. She is pleased.   she has no complaints about her breast.  Exam: Patient looks well. Pleasant. Soft-spoken. Right breast incision, circumareolar is healing uneventfully. No hematoma or infection. Range of motion right shoulder is 100%.  Assessment: Low-grade DCIS right breast, receptor positive. Stage Tis, Nx. Uneventful recovery following right partial mastectomy with needle localization  Plan: Tamoxifen has started She will not need radiation therapy Return to see me in 5 months. Mammograms in one year.   Angelia Mould. Derrell Lolling, M.D., [facility] General and Minimally invasive Surgery Breast and Colorectal Surgery Office:   [phone] Pager:   [phone]

## 2012-06-14 ENCOUNTER — Encounter: Payer: Self-pay | Admitting: *Deleted

## 2012-06-14 NOTE — Progress Notes (Signed)
Mailed after appt letter to pt. 

## 2012-06-22 ENCOUNTER — Encounter: Payer: Self-pay | Admitting: *Deleted

## 2012-06-22 NOTE — Progress Notes (Signed)
Mailed after appt letter to pt. 

## 2012-07-12 ENCOUNTER — Encounter: Payer: Self-pay | Admitting: Oncology

## 2012-07-12 ENCOUNTER — Telehealth: Payer: Self-pay | Admitting: *Deleted

## 2012-07-12 ENCOUNTER — Ambulatory Visit (HOSPITAL_BASED_OUTPATIENT_CLINIC_OR_DEPARTMENT_OTHER): Payer: Medicare Other | Admitting: Oncology

## 2012-07-12 VITALS — BP 173/82 | HR 55 | Temp 97.7°F | Resp 20 | Ht 62.0 in | Wt 143.2 lb

## 2012-07-12 DIAGNOSIS — C50419 Malignant neoplasm of upper-outer quadrant of unspecified female breast: Secondary | ICD-10-CM

## 2012-07-12 DIAGNOSIS — D0590 Unspecified type of carcinoma in situ of unspecified breast: Secondary | ICD-10-CM

## 2012-07-12 DIAGNOSIS — Z170 Estrogen receptor positive status [ER+]: Secondary | ICD-10-CM

## 2012-07-12 NOTE — Patient Instructions (Addendum)
Continue tamoxifen 20 mg daily  Start vitamin D3 1000iu daily  Take calcium (caltrate) 1 daily  I will see you back in 6 months

## 2012-07-12 NOTE — Telephone Encounter (Signed)
appts made and printed...td 

## 2012-07-13 NOTE — Progress Notes (Signed)
OFFICE PROGRESS NOTE  CC  [name], MD [address] [place] [address] Dr. Claud Kelp  Dr. 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 Patient was seen in the Breast Clinic for discussion of her treatment options.   STAGE:  Right breast  TisNx (stage0) DCIS. Low grade  ER+/PR+   PRIOR THERAPY: #1on screening mammography to have a nodule in the right breast on screening mammogram in December.She underwent ultrasound-guided core needle biopsy on 02/19/2012. That demonstrated DCIS with calcifications, involving a papilloma. It is estimated the DCIS was 0.3 cm and was low grade with no necrosis. The DCIS was 100% ER and PR positive  #2 She underwent MRI of her breasts on 03/08/2012. This demonstrated a 3.9 x 1.4 x 1.3 cm area of triangular shaped and linear low grade enhancement in the outer right breast, slightly superiorly, most consistent with post biopsy enhancement and hemorrhage  #3 She underwent lumpectomy on 03/18/2012. The specimen demonstrated atypical ductal hyperplasia but no residual malignancy  #4 patient was subsequently seen by me in February 2014 for discussion of adjuvant therapy. We had discussion regarding use of tamoxifen 20 mg as chemoprevention for DCIS and atypical ductal hyperplasia. She has been on this now for about 3 months time. A total of 5 years of therapy is planned.   CURRENT THERAPY:tamoxifen 20 mg since every 2014  INTERVAL HISTORY: Alexandra Sloan 78 y.o. female returns forfollowup visit at 3 months. Clinically she seems to be doing well she seems to be tolerating tamoxifen without any problems. She denies no fevers chills night sweats hot flashes she has no double vision blurring of vision no vaginal bleeding or discharge no lower extremity swelling or any cough hemoptysis hematemesis chest pains or palpitations no peripheral paresthesias no easy  bruising. Remainder of the 10 point review of systems is negative.  MEDICAL HISTORY: Past Medical History  Diagnosis Date  . Pure hypercholesterolemia   . Benign essential hypertension   . Esophageal reflux   . Anosmia     Loss of smell  . Breast cancer 03/18/2012    Right Breast    ALLERGIES:  has No Known Allergies.  MEDICATIONS:  Current Outpatient Prescriptions  Medication Sig Dispense Refill  . atenolol-chlorthalidone (TENORETIC) 50-25 MG per tablet Daily. Take 1/2 oral daily      . tamoxifen (NOLVADEX) 20 MG tablet Take 1 tablet (20 mg total) by mouth daily.  90 tablet  12  . HYDROcodone-acetaminophen (NORCO/VICODIN) 5-325 MG per tablet Take 1-2 tablets by mouth every 4 (four) hours as needed for pain.  30 tablet  1   No current facility-administered medications for this visit.    SURGICAL HISTORY:  Past Surgical History  Procedure Laterality Date  . Cataract extraction    . Abdominal hysterectomy  03/1997  . Cholecystectomy    . Appendectomy    . Colonoscopy    . Partial mastectomy with needle localization  03/18/2012    Procedure: PARTIAL MASTECTOMY WITH NEEDLE LOCALIZATION;  Surgeon: Ernestene Mention, MD;  Location:  [facility];  Service: General;  Laterality: Right;  . Breast, needle core biopsy  02/13/2012    Ductal Carcinoma In-Situ/ Upper Outer Quadrant    REVIEW OF SYSTEMS:  Pertinent items are noted in HPI.   HEALTH MAINTENANCE:  PHYSICAL EXAMINATION: Blood pressure 173/82, pulse 55, temperature 97.7 F (36.5 C), temperature source Oral, resp. rate 20, height 5'  2" (1.575 m), weight 143 lb 3.2 oz (64.955 kg). Body mass index is 26.18 kg/(m^2). ECOG PERFORMANCE STATUS: 0 - Asymptomatic   well-developed nourished female in no acute distress HEENT exam EOMI PERRLA sclerae anicteric no conjunctival pallor oral mucosa is moist neck is supple lungs are clear bilaterally to auscultation cardiovascular is regular rate rhythm no murmurs abdomen is  soft nontender no HSM extremities no edema neuro patient's alert oriented otherwise nonfocal Right breast well-healed incisional scar no nodularity no evidence of local recurrence no nipple discharge inversion or retraction left breast no masses or nipple discharge.  LABORATORY DATA: Lab Results  Component Value Date   WBC 6.7 04/06/2012   HGB 13.6 04/06/2012   HCT 39.9 04/06/2012   MCV 91.9 04/06/2012   PLT 220 04/06/2012      Chemistry      Component Value Date/Time   NA 140 04/06/2012 1025   NA 140 03/15/2012 1530   K 3.4* 04/06/2012 1025   K 3.0* 03/15/2012 1530   CL 102 04/06/2012 1025   CL 100 03/15/2012 1530   CO2 29 04/06/2012 1025   CO2 27 03/15/2012 1530   BUN 15.0 04/06/2012 1025   BUN 18 03/15/2012 1530   CREATININE 1.0 04/06/2012 1025   CREATININE 1.00 03/15/2012 1530      Component Value Date/Time   CALCIUM 10.2 04/06/2012 1025   CALCIUM 10.1 03/15/2012 1530   ALKPHOS 96 04/06/2012 1025   AST 21 04/06/2012 1025   ALT 19 04/06/2012 1025   BILITOT 0.51 04/06/2012 1025       RADIOGRAPHIC STUDIES:  No results found.  ASSESSMENT: 78 year old female with  #1 new diagnosis of low-grade ductal carcinoma in situ measuring 0.3 cm ER +100% PR positive. Patient underwent a lumpectomy on 03/18/2012 with the final pathology only revealing atypical ductal hyperplasia no residual malignancy. Patient did not require radiation therapy.  #2 patient is on chemoprevention with tamoxifen. She is tolerating it well without any problems total of 5 years therapy is planned.   PLAN:   #1 continue tamoxifen 20 mg daily.  #2 I will see her back in 6 months time for followup.   All questions were answered. The patient knows to call the clinic with any problems, questions or concerns. We can certainly see the patient much sooner if necessary.  I spent 25 minutes counseling the patient face to face. The total time spent in the appointment was 30 minutes.    Drue Second,  MD Medical/Oncology [facility] [phone] (beeper) [phone] (Office)  07/13/2012, 12:11 AM

## 2012-09-17 ENCOUNTER — Ambulatory Visit (INDEPENDENT_AMBULATORY_CARE_PROVIDER_SITE_OTHER): Payer: Medicare Other | Admitting: General Surgery

## 2012-10-04 DIAGNOSIS — I10 Essential (primary) hypertension: Secondary | ICD-10-CM | POA: Insufficient documentation

## 2012-10-28 ENCOUNTER — Encounter (INDEPENDENT_AMBULATORY_CARE_PROVIDER_SITE_OTHER): Payer: Self-pay | Admitting: General Surgery

## 2012-10-28 ENCOUNTER — Ambulatory Visit (INDEPENDENT_AMBULATORY_CARE_PROVIDER_SITE_OTHER): Payer: Medicare Other | Admitting: General Surgery

## 2012-10-28 VITALS — BP 130/74 | HR 60 | Temp 97.2°F | Resp 14 | Ht 62.15 in | Wt 144.0 lb

## 2012-10-28 DIAGNOSIS — C50419 Malignant neoplasm of upper-outer quadrant of unspecified female breast: Secondary | ICD-10-CM

## 2012-10-28 DIAGNOSIS — C50411 Malignant neoplasm of upper-outer quadrant of right female breast: Secondary | ICD-10-CM

## 2012-10-28 NOTE — Progress Notes (Signed)
Patient ID: Alexandra Sloan, female   DOB: 1934/09/12, 78 y.o.   MRN: [id number] History:  This patient returns for long-term followup of her right breast cancer. She underwent right partial mastectomy with needle localization on 03/18/2012. Preoperative image guided biopsy showed low grade DCIS, strongly receptor positive. Final pathology on the lumpectomy specimen showed atypical ductal hyperplasia, negative margins. Final pathologic stage Tis, Nx.  She is followed by Dr. Drue Second and has been started on tamoxifen. She has seen Dr. Lonie Peak, and considering the antiestrogen therapy, it was felt that she would not benefit further from radiation therapy. She is pleased. she has no complaints about her breast She states that she feels good. No complaints about her breast or arm. His staying very active. Tolerating her tamoxifen.  ROS: 10 system review of systems is negative except as described above.  Exam: Alert. Friendly. Appropriate Neck: No adenopathy or mass Lungs: Clear to auscultation bilaterally Heart: Regular rate and rhythm. No murmur. No ectopy Breasts: Right breast with healed circumareolar incision. No mass in either breast. No other skin changes. No axillary adenopathy bilaterally.  Assessment: Low-grade DCIS right breast, receptor positive. Stage Tis, Nx.  Uneventful recovery following right partial mastectomy with needle localization  Plan: Continue tamoxifen Bilateral mammograms January 2015 Return to see me in February 2015   [facility]. Derrell Lolling, M.D., [facility] General and Minimally invasive Surgery Breast and Colorectal Surgery Office:   [phone] Pager:   [phone]

## 2012-10-28 NOTE — Patient Instructions (Signed)
Your examination of your breasts and all of the lymph node areas today is normal. There is no evidence of cancer.  Stay as active as you can.  Continue taking the tamoxifen pill  Be sure to get bilateral mammogram in January, 2015  Return to see Dr. Derrell Lolling in February, 2015

## 2013-01-13 ENCOUNTER — Ambulatory Visit (HOSPITAL_BASED_OUTPATIENT_CLINIC_OR_DEPARTMENT_OTHER): Payer: Medicare Other | Admitting: Oncology

## 2013-01-13 ENCOUNTER — Other Ambulatory Visit (HOSPITAL_BASED_OUTPATIENT_CLINIC_OR_DEPARTMENT_OTHER): Payer: Medicare Other | Admitting: Lab

## 2013-01-13 ENCOUNTER — Encounter: Payer: Self-pay | Admitting: Oncology

## 2013-01-13 ENCOUNTER — Encounter (INDEPENDENT_AMBULATORY_CARE_PROVIDER_SITE_OTHER): Payer: Self-pay

## 2013-01-13 VITALS — BP 176/80 | HR 55 | Temp 97.7°F | Resp 17 | Ht 62.0 in | Wt 142.9 lb

## 2013-01-13 DIAGNOSIS — D0590 Unspecified type of carcinoma in situ of unspecified breast: Secondary | ICD-10-CM

## 2013-01-13 DIAGNOSIS — C50411 Malignant neoplasm of upper-outer quadrant of right female breast: Secondary | ICD-10-CM

## 2013-01-13 DIAGNOSIS — Z170 Estrogen receptor positive status [ER+]: Secondary | ICD-10-CM

## 2013-01-13 DIAGNOSIS — C50419 Malignant neoplasm of upper-outer quadrant of unspecified female breast: Secondary | ICD-10-CM

## 2013-01-13 LAB — COMPREHENSIVE METABOLIC PANEL (CC13)
ALT: 22 U/L (ref 0–55)
Albumin: 4 g/dL (ref 3.5–5.0)
Anion Gap: 10 mEq/L (ref 3–11)
CO2: 27 mEq/L (ref 22–29)
Chloride: 104 mEq/L (ref 98–109)
Glucose: 95 mg/dl (ref 70–140)
Potassium: 4.3 mEq/L (ref 3.5–5.1)
Sodium: 141 mEq/L (ref 136–145)
Total Bilirubin: 0.38 mg/dL (ref 0.20–1.20)
Total Protein: 8 g/dL (ref 6.4–8.3)

## 2013-01-13 LAB — CBC WITH DIFFERENTIAL/PLATELET
BASO%: 1.3 % (ref 0.0–2.0)
Eosinophils Absolute: 0.1 10*3/uL (ref 0.0–0.5)
LYMPH%: 36.8 % (ref 14.0–49.7)
MCHC: 34.1 g/dL (ref 31.5–36.0)
MONO#: 0.5 10*3/uL (ref 0.1–0.9)
NEUT#: 3.5 10*3/uL (ref 1.5–6.5)
RBC: 4.29 10*6/uL (ref 3.70–5.45)
RDW: 13.2 % (ref 11.2–14.5)
WBC: 6.7 10*3/uL (ref 3.9–10.3)
lymph#: 2.5 10*3/uL (ref 0.9–3.3)

## 2013-01-13 NOTE — Progress Notes (Signed)
OFFICE PROGRESS NOTE  CC  No primary provider on file. No primary provider on file. Dr. Claud Kelp  Dr. 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 Patient was seen in the Breast Clinic for discussion of her treatment options.   STAGE:  Right breast  TisNx (stage0) DCIS. Low grade  ER+/PR+   PRIOR THERAPY: #1on screening mammography to have a nodule in the right breast on screening mammogram in December.She underwent ultrasound-guided core needle biopsy on 02/19/2012. That demonstrated DCIS with calcifications, involving a papilloma. It is estimated the DCIS was 0.3 cm and was low grade with no necrosis. The DCIS was 100% ER and PR positive  #2 She underwent MRI of her breasts on 03/08/2012. This demonstrated a 3.9 x 1.4 x 1.3 cm area of triangular shaped and linear low grade enhancement in the outer right breast, slightly superiorly, most consistent with post biopsy enhancement and hemorrhage  #3 She underwent lumpectomy on 03/18/2012. The specimen demonstrated atypical ductal hyperplasia but no residual malignancy  #4 patient was subsequently seen by me in February 2014 for discussion of adjuvant therapy. We had discussion regarding use of tamoxifen 20 mg as chemoprevention for DCIS and atypical ductal hyperplasia. She has been on this now for about 3 months time. A total of 5 years of therapy is planned.   CURRENT THERAPY:tamoxifen 20 mg since February 2014  INTERVAL HISTORY: Alexandra Sloan 78 y.o. female returns forfollowup visit at 6 months. Clinically she seems to be doing well she seems to be tolerating tamoxifen without any problems. She denies no fevers chills night sweats hot flashes she has no double vision blurring of vision no vaginal bleeding or discharge no lower extremity swelling or any cough hemoptysis hematemesis chest pains or palpitations no peripheral paresthesias no easy  bruising. Remainder of the 10 point review of systems is negative.  MEDICAL HISTORY: Past Medical History  Diagnosis Date  . Pure hypercholesterolemia   . Benign essential hypertension   . Esophageal reflux   . Anosmia     Loss of smell  . Breast cancer 03/18/2012    Right Breast    ALLERGIES:  has No Known Allergies.  MEDICATIONS:  Current Outpatient Prescriptions  Medication Sig Dispense Refill  . atenolol-chlorthalidone (TENORETIC) 50-25 MG per tablet Daily. Take 1/2 oral daily      . Calcium Carb-Cholecalciferol (CALCIUM + D3) 600-200 MG-UNIT TABS Take by mouth.      . Cholecalciferol (VITAMIN D3) 1000 UNITS CAPS Take by mouth.      . tamoxifen (NOLVADEX) 20 MG tablet Take 1 tablet (20 mg total) by mouth daily.  90 tablet  12   No current facility-administered medications for this visit.    SURGICAL HISTORY:  Past Surgical History  Procedure Laterality Date  . Cataract extraction    . Abdominal hysterectomy  03/1997  . Cholecystectomy    . Appendectomy    . Colonoscopy    . Partial mastectomy with needle localization  03/18/2012    Procedure: PARTIAL MASTECTOMY WITH NEEDLE LOCALIZATION;  Surgeon: Ernestene Mention, MD;  Location: [facility];  Service: General;  Laterality: Right;  . Breast, needle core biopsy  02/13/2012    Ductal Carcinoma In-Situ/ Upper Outer Quadrant    REVIEW OF SYSTEMS:  Pertinent items are noted in HPI.   HEALTH MAINTENANCE:  PHYSICAL EXAMINATION: Blood pressure 176/80, pulse 55, temperature 97.7 F (36.5 C), temperature source  Oral, resp. rate 17, height 5\' 2"  (1.575 m), weight 142 lb 14.4 oz (64.819 kg), SpO2 96.00%. Body mass index is 26.13 kg/(m^2). ECOG PERFORMANCE STATUS: 0 - Asymptomatic   well-developed nourished female in no acute distress HEENT exam EOMI PERRLA sclerae anicteric no conjunctival pallor oral mucosa is moist neck is supple lungs are clear bilaterally to auscultation cardiovascular is regular rate rhythm  no murmurs abdomen is soft nontender no HSM extremities no edema neuro patient's alert oriented otherwise nonfocal Right breast well-healed incisional scar no nodularity no evidence of local recurrence no nipple discharge inversion or retraction left breast no masses or nipple discharge.  LABORATORY DATA: Lab Results  Component Value Date   WBC 6.7 01/13/2013   HGB 13.5 01/13/2013   HCT 39.7 01/13/2013   MCV 92.6 01/13/2013   PLT 232 01/13/2013      Chemistry      Component Value Date/Time   NA 141 01/13/2013 0959   NA 140 03/15/2012 1530   K 4.3 01/13/2013 0959   K 3.0* 03/15/2012 1530   CL 102 04/06/2012 1025   CL 100 03/15/2012 1530   CO2 27 01/13/2013 0959   CO2 27 03/15/2012 1530   BUN 16.4 01/13/2013 0959   BUN 18 03/15/2012 1530   CREATININE 1.1 01/13/2013 0959   CREATININE 1.00 03/15/2012 1530      Component Value Date/Time   CALCIUM 10.8* 01/13/2013 0959   CALCIUM 10.1 03/15/2012 1530   ALKPHOS 51 01/13/2013 0959   AST 22 01/13/2013 0959   ALT 22 01/13/2013 0959   BILITOT 0.38 01/13/2013 0959       RADIOGRAPHIC STUDIES:  No results found.  ASSESSMENT: 78 year old female with  #1 new diagnosis of low-grade ductal carcinoma in situ measuring 0.3 cm ER +100% PR positive. Patient underwent a lumpectomy on 03/18/2012 with the final pathology only revealing atypical ductal hyperplasia no residual malignancy. Patient did not require radiation therapy.  #2 patient is on chemoprevention with tamoxifen. She is tolerating it well without any problems total of 5 years therapy is planned.   PLAN:   #1 continue tamoxifen 20 mg daily.  #2 I will see her back in 6 months time for followup.   All questions were answered. The patient knows to call the clinic with any problems, questions or concerns. We can certainly see the patient much sooner if necessary.  I spent 15 minutes counseling the patient face to face. The total time spent in the appointment was 20  minutes.    Drue Second, MD Medical/Oncology [facility] [phone] (beeper) [phone] (Office)  01/13/2013, 11:24 AM

## 2013-01-14 ENCOUNTER — Telehealth: Payer: Self-pay | Admitting: Oncology

## 2013-01-14 NOTE — Telephone Encounter (Signed)
sw pt adv of appts made per 11/21 POF Cal for May 2015 mailed shh

## 2013-02-28 ENCOUNTER — Other Ambulatory Visit: Payer: Self-pay | Admitting: Internal Medicine

## 2013-02-28 ENCOUNTER — Other Ambulatory Visit (INDEPENDENT_AMBULATORY_CARE_PROVIDER_SITE_OTHER): Payer: Self-pay | Admitting: General Surgery

## 2013-02-28 DIAGNOSIS — Z853 Personal history of malignant neoplasm of breast: Secondary | ICD-10-CM

## 2013-03-18 ENCOUNTER — Ambulatory Visit
Admission: RE | Admit: 2013-03-18 | Discharge: 2013-03-18 | Disposition: A | Discharge status: Home / Self Care | Payer: Medicare Other | Source: Ambulatory Visit | Attending: General Surgery | Admitting: General Surgery

## 2013-03-18 DIAGNOSIS — Z853 Personal history of malignant neoplasm of breast: Secondary | ICD-10-CM

## 2013-04-15 ENCOUNTER — Ambulatory Visit (INDEPENDENT_AMBULATORY_CARE_PROVIDER_SITE_OTHER): Payer: Medicare Other | Admitting: General Surgery

## 2013-04-15 ENCOUNTER — Encounter (INDEPENDENT_AMBULATORY_CARE_PROVIDER_SITE_OTHER): Payer: Self-pay | Admitting: General Surgery

## 2013-04-15 VITALS — BP 160/110 | HR 68 | Temp 98.0°F | Resp 14 | Ht 62.5 in | Wt 145.4 lb

## 2013-04-15 DIAGNOSIS — C50419 Malignant neoplasm of upper-outer quadrant of unspecified female breast: Secondary | ICD-10-CM

## 2013-04-15 NOTE — Patient Instructions (Signed)
Examination of your breasts and all the lymph node areas today is normal. Your recent mammograms are also normal.  There is no evidence of cancer.  Continue to take the tamoxifen and keep your appointments with Dr. Marcy Panning  Return to see Dr. Dalbert Batman in one year after you get your mammograms.

## 2013-04-15 NOTE — Progress Notes (Signed)
Patient ID: Alexandra Sloan, female   DOB: 02/19/1935, 79 y.o.   MRN: [id number]  History:  This patient returns for long-term followup of her right breast cancer.  She underwent right partial mastectomy with needle localization on 03/18/2012. Preoperative image guided biopsy showed low grade DCIS, strongly receptor positive. Final pathology on the lumpectomy specimen showed atypical ductal hyperplasia, negative margins. Final pathologic stage Tis, Nx.  She is followed by Dr. Marcy Panning and has been started on tamoxifen. She saw Dr. Eppie Gibson, and considering the antiestrogen therapy, it was felt that she would not benefit further from radiation therapy. She has no complaints about her breast  She states that she feels good. No complaints about her breast or arm. His staying very active. Tolerating her tamoxifen. She had bilateral mammograms on 03/18/2013 which looked good, no focal abnormality, BI-RADS category 2.  No new health problems. Followed by Dr. Teressa Lower in [place] for primary care.  Past history, family history, social history, and review of systems are documented on the chart, unchanged, and noncontributory except as described above.   Exam:  Alert. Friendly. Appropriate  Neck: No adenopathy or mass  Lungs: Clear to auscultation bilaterally  Heart: Regular rate and rhythm. No murmur. No ectopy  Breasts: Right breast with healed circumareolar incision UOQ.. No mass in either breast. No other skin changes. No axillary adenopathy bilaterally.   Assessment:  Low-grade DCIS right breast, receptor positive. Stage Tis, Nx.  No evidence of recurrence one year following right partial mastectomy and adjuvant tamoxifen  Plan:  Continue tamoxifen  Bilateral mammograms January 2016  Return to see me in February 2016    [facility]. Dalbert Batman, M.D., [facility] General and Minimally invasive Surgery Breast and Colorectal Surgery Office:    [phone] Pager:   [phone]

## 2013-04-18 ENCOUNTER — Other Ambulatory Visit: Payer: Self-pay | Admitting: *Deleted

## 2013-04-18 DIAGNOSIS — C50419 Malignant neoplasm of upper-outer quadrant of unspecified female breast: Secondary | ICD-10-CM

## 2013-04-18 MED ORDER — TAMOXIFEN CITRATE 20 MG PO TABS: 20.0000 mg | ORAL_TABLET | Freq: Every day | ORAL | Status: DC

## 2013-04-18 NOTE — Telephone Encounter (Signed)
PHONE [phone] [place]

## 2013-07-01 ENCOUNTER — Telehealth: Payer: Self-pay

## 2013-07-01 NOTE — Telephone Encounter (Signed)
Let pt know KK out on LOA.  Reschedule 5/22 to 245 lab and 315 with AJ.  Pt voiced understanding.

## 2013-07-15 ENCOUNTER — Other Ambulatory Visit (HOSPITAL_BASED_OUTPATIENT_CLINIC_OR_DEPARTMENT_OTHER): Payer: Medicare Other

## 2013-07-15 ENCOUNTER — Ambulatory Visit (HOSPITAL_BASED_OUTPATIENT_CLINIC_OR_DEPARTMENT_OTHER): Payer: Medicare Other | Admitting: Physician Assistant

## 2013-07-15 ENCOUNTER — Ambulatory Visit: Payer: Medicare Other | Admitting: Oncology

## 2013-07-15 ENCOUNTER — Other Ambulatory Visit: Payer: Medicare Other

## 2013-07-15 ENCOUNTER — Encounter: Payer: Self-pay | Admitting: Physician Assistant

## 2013-07-15 VITALS — BP 163/86 | HR 79 | Temp 97.0°F | Resp 18 | Ht 62.0 in | Wt 143.4 lb

## 2013-07-15 DIAGNOSIS — D0590 Unspecified type of carcinoma in situ of unspecified breast: Secondary | ICD-10-CM

## 2013-07-15 DIAGNOSIS — C50411 Malignant neoplasm of upper-outer quadrant of right female breast: Secondary | ICD-10-CM

## 2013-07-15 DIAGNOSIS — Z170 Estrogen receptor positive status [ER+]: Secondary | ICD-10-CM

## 2013-07-15 DIAGNOSIS — C50419 Malignant neoplasm of upper-outer quadrant of unspecified female breast: Secondary | ICD-10-CM

## 2013-07-15 LAB — CBC WITH DIFFERENTIAL/PLATELET
BASO%: 0.6 % (ref 0.0–2.0)
Basophils Absolute: 0 10*3/uL (ref 0.0–0.1)
EOS ABS: 0.3 10*3/uL (ref 0.0–0.5)
EOS%: 3.6 % (ref 0.0–7.0)
HEMATOCRIT: 38 % (ref 34.8–46.6)
HGB: 12.7 g/dL (ref 11.6–15.9)
LYMPH%: 41.5 % (ref 14.0–49.7)
MCH: 30.9 pg (ref 25.1–34.0)
MCHC: 33.3 g/dL (ref 31.5–36.0)
MCV: 92.8 fL (ref 79.5–101.0)
MONO#: 0.6 10*3/uL (ref 0.1–0.9)
MONO%: 8 % (ref 0.0–14.0)
NEUT%: 46.3 % (ref 38.4–76.8)
NEUTROS ABS: 3.4 10*3/uL (ref 1.5–6.5)
Platelets: 243 10*3/uL (ref 145–400)
RBC: 4.09 10*6/uL (ref 3.70–5.45)
RDW: 13.5 % (ref 11.2–14.5)
WBC: 7.4 10*3/uL (ref 3.9–10.3)
lymph#: 3.1 10*3/uL (ref 0.9–3.3)

## 2013-07-15 LAB — COMPREHENSIVE METABOLIC PANEL (CC13)
ALT: 22 U/L (ref 0–55)
ANION GAP: 12 meq/L — AB (ref 3–11)
AST: 21 U/L (ref 5–34)
Albumin: 4 g/dL (ref 3.5–5.0)
Alkaline Phosphatase: 50 U/L (ref 40–150)
BILIRUBIN TOTAL: 0.44 mg/dL (ref 0.20–1.20)
BUN: 13.9 mg/dL (ref 7.0–26.0)
CO2: 22 mEq/L (ref 22–29)
CREATININE: 0.9 mg/dL (ref 0.6–1.1)
Calcium: 9.8 mg/dL (ref 8.4–10.4)
Chloride: 105 mEq/L (ref 98–109)
GLUCOSE: 94 mg/dL (ref 70–140)
Potassium: 3.3 mEq/L — ABNORMAL LOW (ref 3.5–5.1)
Sodium: 139 mEq/L (ref 136–145)
Total Protein: 7.5 g/dL (ref 6.4–8.3)

## 2013-07-15 MED ORDER — TAMOXIFEN CITRATE 20 MG PO TABS: 20.0000 mg | ORAL_TABLET | Freq: Every day | ORAL | Status: DC

## 2013-07-15 NOTE — Progress Notes (Addendum)
OFFICE PROGRESS NOTE  CC  DOUGH,ROBERT, MD [place] [address] Dr. Fanny Skates  Dr. 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 Patient was seen in the Breast Clinic for discussion of her treatment options.   STAGE:  Right breast  TisNx (stage0) DCIS. Low grade  ER+/PR+   PRIOR THERAPY: #1on screening mammography to have a nodule in the right breast on screening mammogram in December.She underwent ultrasound-guided core needle biopsy on 02/19/2012. That demonstrated DCIS with calcifications, involving a papilloma. It is estimated the DCIS was 0.3 cm and was low grade with no necrosis. The DCIS was 100% ER and PR positive  #2 She underwent MRI of her breasts on 03/08/2012. This demonstrated a 3.9 x 1.4 x 1.3 cm area of triangular shaped and linear low grade enhancement in the outer right breast, slightly superiorly, most consistent with post biopsy enhancement and hemorrhage  #3 She underwent lumpectomy on 03/18/2012. The specimen demonstrated atypical ductal hyperplasia but no residual malignancy  #4 patient was subsequently seen by me in February 2014 for discussion of adjuvant therapy. We had discussion regarding use of tamoxifen 20 mg as chemoprevention for DCIS and atypical ductal hyperplasia. She has been on this now for about 3 months time. A total of 5 years of therapy is planned.   CURRENT THERAPY:tamoxifen 20 mg since February 2014  INTERVAL HISTORY: Alexandra Sloan 79 y.o. female returns forfollowup visit at 6 months. Clinically she seems to be doing well she seems to be tolerating tamoxifen without any problems. She denies no fevers chills or night sweats. She does report hot flashes but states this is a long standing issue. Her hot flashes are slightly better. She has no double vision blurring of vision no vaginal bleeding or discharge no lower extremity swelling or any  cough hemoptysis hematemesis chest pains or palpitations no peripheral paresthesias no easy bruising. She is status post cataract surgery on her right eye in August of 2014. She has a new primary care provider. Under his direction she is taking Calcium 600/Vitamin D 800, one tablet daily. She takes an additional 1000 IU of Vitamin D3 every other day. Her mammogram done on 03/18/2013 showed no evidence of malignancy. Remainder of the 10 point review of systems is negative.  MEDICAL HISTORY: Past Medical History  Diagnosis Date  . Pure hypercholesterolemia   . Benign essential hypertension   . Esophageal reflux   . Anosmia     Loss of smell  . Breast cancer 03/18/2012    Right Breast    ALLERGIES:  has No Known Allergies.  MEDICATIONS:  Current Outpatient Prescriptions  Medication Sig Dispense Refill  . Calcium Carb-Cholecalciferol (CALCIUM + D3) 600-200 MG-UNIT TABS Take by mouth.      . Cholecalciferol (VITAMIN D3) 1000 UNITS CAPS Take by mouth.      . tamoxifen (NOLVADEX) 20 MG tablet Take 1 tablet (20 mg total) by mouth daily.  90 tablet  1  . valsartan-hydrochlorothiazide (DIOVAN-HCT) 80-12.5 MG per tablet        No current facility-administered medications for this visit.    SURGICAL HISTORY:  Past Surgical History  Procedure Laterality Date  . Cataract extraction    . Abdominal hysterectomy  03/1997  . Cholecystectomy    . Appendectomy    . Colonoscopy    . Partial mastectomy with needle localization  03/18/2012    Procedure: PARTIAL MASTECTOMY WITH NEEDLE  LOCALIZATION;  Surgeon: Adin Hector, MD;  Location: [place];  Service: General;  Laterality: Right;  . Breast, needle core biopsy  02/13/2012    Ductal Carcinoma In-Situ/ Upper Outer Quadrant    REVIEW OF SYSTEMS:  Pertinent items are noted in HPI.   HEALTH MAINTENANCE:  PHYSICAL EXAMINATION: Blood pressure 163/86, pulse 79, temperature 97 F (36.1 C), temperature source Oral, resp. rate 18,  height 5\' 2"  (1.575 m), weight 143 lb 6.4 oz (65.046 kg). Body mass index is 26.22 kg/(m^2).  ECOG PERFORMANCE STATUS: 0 - Asymptomatic   well-developed nourished female in no acute distress HEENT exam EOMI PERRLA sclerae anicteric no conjunctival pallor oral mucosa is moist neck is supple lungs are clear bilaterally to auscultation cardiovascular is regular rate rhythm no murmurs abdomen is soft nontender no HSM extremities no edema neuro patient's alert oriented otherwise nonfocal Right breast well-healed incisional scar no nodularity no evidence of local recurrence no nipple discharge inversion or retraction left breast no masses or nipple discharge.Left breast without masses, skin changes or nipple discharge. No palpable  lymphadenopathy bilateral axilla  LABORATORY DATA: Lab Results  Component Value Date   WBC 7.4 07/15/2013   HGB 12.7 07/15/2013   HCT 38.0 07/15/2013   MCV 92.8 07/15/2013   PLT 243 07/15/2013      Chemistry      Component Value Date/Time   NA 141 01/13/2013 0959   NA 140 03/15/2012 1530   K 4.3 01/13/2013 0959   K 3.0* 03/15/2012 1530   CL 102 04/06/2012 1025   CL 100 03/15/2012 1530   CO2 27 01/13/2013 0959   CO2 27 03/15/2012 1530   BUN 16.4 01/13/2013 0959   BUN 18 03/15/2012 1530   CREATININE 1.1 01/13/2013 0959   CREATININE 1.00 03/15/2012 1530      Component Value Date/Time   CALCIUM 10.8* 01/13/2013 0959   CALCIUM 10.1 03/15/2012 1530   ALKPHOS 51 01/13/2013 0959   AST 22 01/13/2013 0959   ALT 22 01/13/2013 0959   BILITOT 0.38 01/13/2013 0959       RADIOGRAPHIC STUDIES:  No results found.  ASSESSMENT: 79 year old female with  #1 new diagnosis of low-grade ductal carcinoma in situ measuring 0.3 cm ER +100% PR positive. Patient underwent a lumpectomy on 03/18/2012 with the final pathology only revealing atypical ductal hyperplasia no residual malignancy. Patient did not require radiation therapy.  #2 patient is on chemoprevention with tamoxifen. She  is tolerating it well without any problems total of 5 years therapy is planned.   PLAN:   #1 continue tamoxifen 20 mg daily.  #2 She will return in 6 months time for followup.   All questions were answered. The patient knows to call the clinic with any problems, questions or concerns. We can certainly see the patient much sooner if necessary.  I spent 20 minutes counseling the patient face to face. The total time spent in the appointment was 300 minutes.    Carlton Adam, PA-C Medical/Oncology [facility] [phone] (Office)  07/15/2013, 3:21 PM

## 2013-07-17 NOTE — Patient Instructions (Addendum)
Continue taking Tamoxifen as prescribed Follow up in 6 months

## 2013-07-19 ENCOUNTER — Telehealth: Payer: Self-pay | Admitting: Oncology

## 2013-07-19 NOTE — Telephone Encounter (Signed)
s.w. pt and advised on NOV appt....pt ok and aware °

## 2014-01-13 ENCOUNTER — Other Ambulatory Visit (HOSPITAL_BASED_OUTPATIENT_CLINIC_OR_DEPARTMENT_OTHER): Payer: Medicare Other

## 2014-01-13 ENCOUNTER — Ambulatory Visit (HOSPITAL_BASED_OUTPATIENT_CLINIC_OR_DEPARTMENT_OTHER): Payer: Medicare Other | Admitting: Adult Health

## 2014-01-13 ENCOUNTER — Encounter: Payer: Self-pay | Admitting: Adult Health

## 2014-01-13 ENCOUNTER — Telehealth: Payer: Self-pay | Admitting: Adult Health

## 2014-01-13 VITALS — BP 175/82 | HR 95 | Temp 97.6°F | Resp 18 | Ht 62.0 in | Wt 142.3 lb

## 2014-01-13 DIAGNOSIS — N6091 Unspecified benign mammary dysplasia of right breast: Secondary | ICD-10-CM

## 2014-01-13 DIAGNOSIS — C50419 Malignant neoplasm of upper-outer quadrant of unspecified female breast: Secondary | ICD-10-CM

## 2014-01-13 DIAGNOSIS — D0511 Intraductal carcinoma in situ of right breast: Secondary | ICD-10-CM

## 2014-01-13 DIAGNOSIS — C50411 Malignant neoplasm of upper-outer quadrant of right female breast: Secondary | ICD-10-CM

## 2014-01-13 LAB — CBC WITH DIFFERENTIAL/PLATELET
BASO%: 0.4 % (ref 0.0–2.0)
Basophils Absolute: 0 10*3/uL (ref 0.0–0.1)
EOS ABS: 0.2 10*3/uL (ref 0.0–0.5)
EOS%: 2 % (ref 0.0–7.0)
HCT: 38.2 % (ref 34.8–46.6)
HGB: 13 g/dL (ref 11.6–15.9)
LYMPH#: 3.6 10*3/uL — AB (ref 0.9–3.3)
LYMPH%: 47.3 % (ref 14.0–49.7)
MCH: 31.2 pg (ref 25.1–34.0)
MCHC: 34 g/dL (ref 31.5–36.0)
MCV: 91.6 fL (ref 79.5–101.0)
MONO#: 0.6 10*3/uL (ref 0.1–0.9)
MONO%: 7.6 % (ref 0.0–14.0)
NEUT#: 3.2 10*3/uL (ref 1.5–6.5)
NEUT%: 42.7 % (ref 38.4–76.8)
Platelets: 262 10*3/uL (ref 145–400)
RBC: 4.17 10*6/uL (ref 3.70–5.45)
RDW: 13 % (ref 11.2–14.5)
WBC: 7.6 10*3/uL (ref 3.9–10.3)

## 2014-01-13 LAB — COMPREHENSIVE METABOLIC PANEL (CC13)
ALBUMIN: 4.2 g/dL (ref 3.5–5.0)
ALT: 30 U/L (ref 0–55)
AST: 25 U/L (ref 5–34)
Alkaline Phosphatase: 65 U/L (ref 40–150)
Anion Gap: 11 mEq/L (ref 3–11)
BUN: 15.4 mg/dL (ref 7.0–26.0)
CO2: 22 mEq/L (ref 22–29)
Calcium: 9.9 mg/dL (ref 8.4–10.4)
Chloride: 106 mEq/L (ref 98–109)
Creatinine: 1.1 mg/dL (ref 0.6–1.1)
GLUCOSE: 99 mg/dL (ref 70–140)
Potassium: 3.8 mEq/L (ref 3.5–5.1)
SODIUM: 140 meq/L (ref 136–145)
TOTAL PROTEIN: 7.7 g/dL (ref 6.4–8.3)
Total Bilirubin: 0.34 mg/dL (ref 0.20–1.20)

## 2014-01-13 MED ORDER — TAMOXIFEN CITRATE 20 MG PO TABS: 20.0000 mg | ORAL_TABLET | Freq: Every day | ORAL | Status: DC

## 2014-01-13 NOTE — Progress Notes (Signed)
OFFICE PROGRESS NOTE  CC  DOUGH,ROBERT, MD [place] [address] Dr. Fanny Skates  Dr. 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 Patient was seen in the Breast Clinic for discussion of her treatment options.   STAGE:  Right breast  TisNx (stage0) DCIS. Low grade  ER+/PR+   PRIOR THERAPY: #1on screening mammography to have a nodule in the right breast on screening mammogram in December.She underwent ultrasound-guided core needle biopsy on 02/19/2012. That demonstrated DCIS with calcifications, involving a papilloma. It is estimated the DCIS was 0.3 cm and was low grade with no necrosis. The DCIS was 100% ER and PR positive  #2 She underwent MRI of her breasts on 03/08/2012. This demonstrated a 3.9 x 1.4 x 1.3 cm area of triangular shaped and linear low grade enhancement in the outer right breast, slightly superiorly, most consistent with post biopsy enhancement and hemorrhage  #3 She underwent lumpectomy on 03/18/2012. The specimen demonstrated atypical ductal hyperplasia but no residual malignancy  #4 patient was subsequently seen by me in February 2014 for discussion of adjuvant therapy. We had discussion regarding use of tamoxifen 20 mg as chemoprevention for DCIS and atypical ductal hyperplasia. She has been on this now for about 3 months time. A total of 5 years of therapy is planned.   CURRENT THERAPY:tamoxifen 20 mg since February 2014  INTERVAL HISTORY: Alexandra Sloan 79 y.o. female returns for evaluation of her h/o DCIS.  She is doing well today.  She is taking Tamoxifen daily and tells me that she is tolerating it well.  She denies hot flashes, joint aches, vaginal dryness, or any further concerns.  We updated her health maintenance below.    MEDICAL HISTORY: Past Medical History  Diagnosis Date  . Pure hypercholesterolemia   . Benign essential hypertension   .  Esophageal reflux   . Anosmia     Loss of smell  . Breast cancer 03/18/2012    Right Breast    ALLERGIES:  has No Known Allergies.  MEDICATIONS:  Current Outpatient Prescriptions  Medication Sig Dispense Refill  . Calcium Carb-Cholecalciferol (CALCIUM + D3) 600-200 MG-UNIT TABS Take by mouth.    . tamoxifen (NOLVADEX) 20 MG tablet Take 1 tablet (20 mg total) by mouth daily. 90 tablet 1  . valsartan-hydrochlorothiazide (DIOVAN-HCT) 80-12.5 MG per tablet      No current facility-administered medications for this visit.    SURGICAL HISTORY:  Past Surgical History  Procedure Laterality Date  . Cataract extraction    . Abdominal hysterectomy  03/1997  . Cholecystectomy    . Appendectomy    . Colonoscopy    . Partial mastectomy with needle localization  03/18/2012    Procedure: PARTIAL MASTECTOMY WITH NEEDLE LOCALIZATION;  Surgeon: Adin Hector, MD;  Location: [facility];  Service: General;  Laterality: Right;  . Breast, needle core biopsy  02/13/2012    Ductal Carcinoma In-Situ/ Upper Outer Quadrant    REVIEW OF SYSTEMS:  A 10 point review of systems was conducted and is otherwise negative except for what is noted above.      Health Maintenance  Mammogram: 03/18/2013 Colonoscopy: 2008, 10 year follow up recommended Bone Density Scan: never Pap Smear: doesn't have anymore Eye Exam: 2014 Lipid Panel:unsure   PHYSICAL EXAMINATION: Blood pressure 175/82, pulse 95, temperature 97.6 F (36.4 C), temperature source Oral, resp. rate 18, height 5\' 2"  (1.575 m), weight  142 lb 4.8 oz (64.547 kg). Body mass index is 26.02 kg/(m^2). GENERAL: Patient is a well appearing female in no acute distress HEENT:  Sclerae anicteric.  Oropharynx clear and moist. No ulcerations or evidence of oropharyngeal candidiasis. Neck is supple.  NODES:  No cervical, supraclavicular, or axillary lymphadenopathy palpated.  BREAST EXAM:  LUNGS:  Clear to auscultation bilaterally.  No wheezes  or rhonchi. HEART:  Regular rate and rhythm. No murmur appreciated. ABDOMEN:  Soft, nontender.  Positive, normoactive bowel sounds. No organomegaly palpated. MSK:  No focal spinal tenderness to palpation. Full range of motion bilaterally in the upper extremities. EXTREMITIES:  No peripheral edema.   SKIN:  Clear with no obvious rashes or skin changes. No nail dyscrasia. NEURO:  Nonfocal. Well oriented.  Appropriate affect.    ECOG PERFORMANCE STATUS: 0 - Asymptomatic  LABORATORY DATA: Lab Results  Component Value Date   WBC 7.6 01/13/2014   HGB 13.0 01/13/2014   HCT 38.2 01/13/2014   MCV 91.6 01/13/2014   PLT 262 01/13/2014      Chemistry      Component Value Date/Time   NA 139 07/15/2013 1444   NA 140 03/15/2012 1530   K 3.3* 07/15/2013 1444   K 3.0* 03/15/2012 1530   CL 102 04/06/2012 1025   CL 100 03/15/2012 1530   CO2 22 07/15/2013 1444   CO2 27 03/15/2012 1530   BUN 13.9 07/15/2013 1444   BUN 18 03/15/2012 1530   CREATININE 0.9 07/15/2013 1444   CREATININE 1.00 03/15/2012 1530      Component Value Date/Time   CALCIUM 9.8 07/15/2013 1444   CALCIUM 10.1 03/15/2012 1530   ALKPHOS 50 07/15/2013 1444   AST 21 07/15/2013 1444   ALT 22 07/15/2013 1444   BILITOT 0.44 07/15/2013 1444       RADIOGRAPHIC STUDIES:  No results found.  ASSESSMENT: 79 year old female with  #1 new diagnosis of low-grade ductal carcinoma in situ measuring 0.3 cm ER +100% PR positive. Patient underwent a lumpectomy on 03/18/2012 with the final pathology only revealing atypical ductal hyperplasia no residual malignancy. Patient did not require radiation therapy.  #2 patient is on chemoprevention with tamoxifen. A total of 5 years therapy is planned.   PLAN:  Alexandra Sloan is doing well today.  She has no sign of recurrence.  I reviewed her normal CBC with her in detail.  A cmp is pending.  She will continue taking tamoxifen daily.  She has no vaginal bleeding or discharge.  She was recommended  healthy diet, exercise, and monthly breast exams.    At her request she will return in one year for labs and an office visit with Dr. Burr Medico.    I did refill her tamoxifen today at her request.    All questions were answered. The patient knows to call the clinic with any problems, questions or concerns. We can certainly see the patient much sooner if necessary.  I spent 25 minutes counseling the patient face to face. The total time spent in the appointment was 300 minutes.   Minette Headland, [place] [phone] 01/13/2014, 11:17 AM

## 2014-01-13 NOTE — Telephone Encounter (Signed)
cld pt & left a message of time & date of appts-mailed copy of sch

## 2014-01-13 NOTE — Patient Instructions (Signed)
You are doing well.  You have no sign of recurrence.  I recommend healthy diet, exercise, monthly breast exams.  Continue taking tamoxifen.    Breast Self-Awareness Practicing breast self-awareness may pick up problems early, prevent significant medical complications, and possibly save your life. By practicing breast self-awareness, you can become familiar with how your breasts look and feel and if your breasts are changing. This allows you to notice changes early. It can also offer you some reassurance that your breast health is good. One way to learn what is normal for your breasts and whether your breasts are changing is to do a breast self-exam. If you find a lump or something that was not present in the past, it is best to contact your caregiver right away. Other findings that should be evaluated by your caregiver include nipple discharge, especially if it is bloody; skin changes or reddening; areas where the skin seems to be pulled in (retracted); or new lumps and bumps. Breast pain is seldom associated with cancer (malignancy), but should also be evaluated by a caregiver. HOW TO PERFORM A BREAST SELF-EXAM The best time to examine your breasts is 5-7 days after your menstrual period is over. During menstruation, the breasts are lumpier, and it may be more difficult to pick up changes. If you do not menstruate, have reached menopause, or had your uterus removed (hysterectomy), you should examine your breasts at regular intervals, such as monthly. If you are breastfeeding, examine your breasts after a feeding or after using a breast pump. Breast implants do not decrease the risk for lumps or tumors, so continue to perform breast self-exams as recommended. Talk to your caregiver about how to determine the difference between the implant and breast tissue. Also, talk about the amount of pressure you should use during the exam. Over time, you will become more familiar with the variations of your breasts and  more comfortable with the exam. A breast self-exam requires you to remove all your clothes above the waist. 1. Look at your breasts and nipples. Stand in front of a mirror in a room with good lighting. With your hands on your hips, push your hands firmly downward. Look for a difference in shape, contour, and size from one breast to the other (asymmetry). Asymmetry includes puckers, dips, or bumps. Also, look for skin changes, such as reddened or scaly areas on the breasts. Look for nipple changes, such as discharge, dimpling, repositioning, or redness. 2. Carefully feel your breasts. This is best done either in the shower or tub while using soapy water or when flat on your back. Place the arm (on the side of the breast you are examining) above your head. Use the pads (not the fingertips) of your three middle fingers on your opposite hand to feel your breasts. Start in the underarm area and use  inch (2 cm) overlapping circles to feel your breast. Use 3 different levels of pressure (light, medium, and firm pressure) at each circle before moving to the next circle. The light pressure is needed to feel the tissue closest to the skin. The medium pressure will help to feel breast tissue a little deeper, while the firm pressure is needed to feel the tissue close to the ribs. Continue the overlapping circles, moving downward over the breast until you feel your ribs below your breast. Then, move one finger-width towards the center of the body. Continue to use the  inch (2 cm) overlapping circles to feel your breast as you move  slowly up toward the collar bone (clavicle) near the base of the neck. Continue the up and down exam using all 3 pressures until you reach the middle of the chest. Do this with each breast, carefully feeling for lumps or changes. 3.  Keep a written record with breast changes or normal findings for each breast. By writing this information down, you do not need to depend only on memory for size,  tenderness, or location. Write down where you are in your menstrual cycle, if you are still menstruating. Breast tissue can have some lumps or thick tissue. However, see your caregiver if you find anything that concerns you.  SEEK MEDICAL CARE IF:  You see a change in shape, contour, or size of your breasts or nipples.   You see skin changes, such as reddened or scaly areas on the breasts or nipples.   You have an unusual discharge from your nipples.   You feel a new lump or unusually thick areas.  Document Released: 02/10/2005 Document Revised: 01/28/2012 Document Reviewed: 05/28/2011 [facility] Patient Information 2015 [place], [place]. This information is not intended to replace advice given to you by your health care provider. Make sure you discuss any questions you have with your health care provider. Tamoxifen oral tablet What is this medicine? TAMOXIFEN (ta MOX i fen) blocks the effects of estrogen. It is commonly used to treat breast cancer. It is also used to decrease the chance of breast cancer coming back in women who have received treatment for the disease. It may also help prevent breast cancer in women who have a high risk of developing breast cancer. This medicine may be used for other purposes; ask your health care provider or pharmacist if you have questions. COMMON BRAND NAME(S): Nolvadex What should I tell my health care provider before I take this medicine? They need to know if you have any of these conditions: -blood clots -blood disease -cataracts or impaired eyesight -endometriosis -high calcium levels -high cholesterol -irregular menstrual cycles -liver disease -stroke -uterine fibroids -an unusual or allergic reaction to tamoxifen, other medicines, foods, dyes, or preservatives -pregnant or trying to get pregnant -breast-feeding How should I use this medicine? Take this medicine by mouth with a glass of water. Follow the directions on the prescription  label. You can take it with or without food. Take your medicine at regular intervals. Do not take your medicine more often than directed. Do not stop taking except on your doctor's advice. A special MedGuide will be given to you by the pharmacist with each prescription and refill. Be sure to read this information carefully each time. Talk to your pediatrician regarding the use of this medicine in children. While this drug may be prescribed for selected conditions, precautions do apply. Overdosage: If you think you have taken too much of this medicine contact a poison control center or emergency room at once. NOTE: This medicine is only for you. Do not share this medicine with others. What if I miss a dose? If you miss a dose, take it as soon as you can. If it is almost time for your next dose, take only that dose. Do not take double or extra doses. What may interact with this medicine? -aminoglutethimide -bromocriptine -chemotherapy drugs -female hormones, like estrogens and birth control pills -letrozole -medroxyprogesterone -phenobarbital -rifampin -warfarin This list may not describe all possible interactions. Give your health care provider a list of all the medicines, herbs, non-prescription drugs, or dietary supplements you use. Also tell them  if you smoke, drink alcohol, or use illegal drugs. Some items may interact with your medicine. What should I watch for while using this medicine? Visit your doctor or health care professional for regular checks on your progress. You will need regular pelvic exams, breast exams, and mammograms. If you are taking this medicine to reduce your risk of getting breast cancer, you should know that this medicine does not prevent all types of breast cancer. If breast cancer or other problems occur, there is no guarantee that it will be found at an early stage. Do not become pregnant while taking this medicine or for 2 months after stopping this medicine. Stop  taking this medicine if you get pregnant or think you are pregnant and contact your doctor. This medicine may harm your unborn baby. Women who can possibly become pregnant should use birth control methods that do not use hormones during tamoxifen treatment and for 2 months after therapy has stopped. Talk with your health care provider for birth control advice. Do not breast feed while taking this medicine. What side effects may I notice from receiving this medicine? Side effects that you should report to your doctor or health care professional as soon as possible: -changes in vision (blurred vision) -changes in your menstrual cycle -difficulty breathing or shortness of breath -difficulty walking or talking -new breast lumps -numbness -pelvic pain or pressure -redness, blistering, peeling or loosening of the skin, including inside the mouth -skin rash or itching (hives) -sudden chest pain -swelling of lips, face, or tongue -swelling, pain or tenderness in your calf or leg -unusual bruising or bleeding -vaginal discharge that is bloody, brown, or rust -weakness -yellowing of the whites of the eyes or skin Side effects that usually do not require medical attention (report to your doctor or health care professional if they continue or are bothersome): -fatigue -hair loss, although uncommon and is usually mild -headache -hot flashes -impotence (in men) -nausea, vomiting (mild) -vaginal discharge (white or clear) This list may not describe all possible side effects. Call your doctor for medical advice about side effects. You may report side effects to FDA at 1-800-FDA-1088. Where should I keep my medicine? Keep out of the reach of children. Store at room temperature between 20 and 25 degrees C (68 and 77 degrees F). Protect from light. Keep container tightly closed. Throw away any unused medicine after the expiration date. NOTE: This sheet is a summary. It may not cover all possible  information. If you have questions about this medicine, talk to your doctor, pharmacist, or health care provider.  2015, [employer]. (2007-10-28 12:01:56)

## 2014-02-27 ENCOUNTER — Other Ambulatory Visit (INDEPENDENT_AMBULATORY_CARE_PROVIDER_SITE_OTHER): Payer: Self-pay | Admitting: General Surgery

## 2014-02-27 DIAGNOSIS — Z853 Personal history of malignant neoplasm of breast: Secondary | ICD-10-CM

## 2014-03-31 ENCOUNTER — Ambulatory Visit
Admission: RE | Admit: 2014-03-31 | Discharge: 2014-03-31 | Disposition: A | Discharge status: Home / Self Care | Payer: Medicare Other | Source: Ambulatory Visit | Attending: General Surgery | Admitting: General Surgery

## 2014-03-31 DIAGNOSIS — Z853 Personal history of malignant neoplasm of breast: Secondary | ICD-10-CM

## 2014-05-05 ENCOUNTER — Telehealth: Payer: Self-pay | Admitting: Hematology

## 2014-05-05 NOTE — Telephone Encounter (Signed)
Confirm appointment changed from 11/25 to 11/18. Mailed calendar.

## 2014-05-26 ENCOUNTER — Telehealth: Payer: Self-pay | Admitting: Hematology

## 2014-05-26 NOTE — Telephone Encounter (Signed)
Due to GI Clinic moved 11/18 lab/YF to 1pm. Spoke with patient she is aware.

## 2015-01-08 ENCOUNTER — Telehealth: Payer: Self-pay | Admitting: Hematology

## 2015-01-08 NOTE — Telephone Encounter (Signed)
pt cld to r/s appt-gave pt r/s time & date °

## 2015-01-12 ENCOUNTER — Other Ambulatory Visit: Payer: Medicare Other

## 2015-01-12 ENCOUNTER — Ambulatory Visit: Payer: Self-pay | Admitting: Hematology

## 2015-01-15 ENCOUNTER — Other Ambulatory Visit: Payer: Self-pay | Admitting: *Deleted

## 2015-01-15 DIAGNOSIS — C50411 Malignant neoplasm of upper-outer quadrant of right female breast: Secondary | ICD-10-CM

## 2015-01-15 MED ORDER — TAMOXIFEN CITRATE 20 MG PO TABS: 20.0000 mg | ORAL_TABLET | Freq: Every day | ORAL | Status: DC

## 2015-01-15 NOTE — Telephone Encounter (Signed)
Patient called and requested refill of her tamoxifen.  She started in 2014 with a total of 5years planned per Dr. Chancy Milroy.  Patient will see Dr. Burr Medico on 01-26-15.  Refilled 90day supply(escribed)  until patient can see Dr. Burr Medico for the first time on 01-26-15.  Patient is fine with this

## 2015-01-19 ENCOUNTER — Ambulatory Visit: Payer: Medicare Other | Admitting: Hematology

## 2015-01-19 ENCOUNTER — Other Ambulatory Visit: Payer: Medicare Other

## 2015-01-25 ENCOUNTER — Other Ambulatory Visit: Payer: Self-pay | Admitting: *Deleted

## 2015-01-25 DIAGNOSIS — C50419 Malignant neoplasm of upper-outer quadrant of unspecified female breast: Secondary | ICD-10-CM

## 2015-01-26 ENCOUNTER — Other Ambulatory Visit (HOSPITAL_BASED_OUTPATIENT_CLINIC_OR_DEPARTMENT_OTHER): Payer: Medicare Other

## 2015-01-26 ENCOUNTER — Ambulatory Visit (HOSPITAL_BASED_OUTPATIENT_CLINIC_OR_DEPARTMENT_OTHER): Payer: Medicare Other | Admitting: Hematology

## 2015-01-26 ENCOUNTER — Encounter: Payer: Self-pay | Admitting: Hematology

## 2015-01-26 VITALS — BP 165/76 | HR 77 | Temp 98.4°F | Resp 18 | Ht 62.0 in | Wt 143.0 lb

## 2015-01-26 DIAGNOSIS — Z170 Estrogen receptor positive status [ER+]: Secondary | ICD-10-CM | POA: Diagnosis not present

## 2015-01-26 DIAGNOSIS — D0511 Intraductal carcinoma in situ of right breast: Secondary | ICD-10-CM

## 2015-01-26 DIAGNOSIS — C50411 Malignant neoplasm of upper-outer quadrant of right female breast: Secondary | ICD-10-CM

## 2015-01-26 DIAGNOSIS — Z79810 Long term (current) use of selective estrogen receptor modulators (SERMs): Secondary | ICD-10-CM

## 2015-01-26 DIAGNOSIS — C50419 Malignant neoplasm of upper-outer quadrant of unspecified female breast: Secondary | ICD-10-CM

## 2015-01-26 LAB — CBC WITH DIFFERENTIAL/PLATELET
BASO%: 1 % (ref 0.0–2.0)
Basophils Absolute: 0.1 10*3/uL (ref 0.0–0.1)
EOS%: 1.4 % (ref 0.0–7.0)
Eosinophils Absolute: 0.1 10*3/uL (ref 0.0–0.5)
HEMATOCRIT: 40.3 % (ref 34.8–46.6)
HGB: 13.4 g/dL (ref 11.6–15.9)
LYMPH#: 2.9 10*3/uL (ref 0.9–3.3)
LYMPH%: 39.9 % (ref 14.0–49.7)
MCH: 30.7 pg (ref 25.1–34.0)
MCHC: 33.4 g/dL (ref 31.5–36.0)
MCV: 92.1 fL (ref 79.5–101.0)
MONO#: 0.6 10*3/uL (ref 0.1–0.9)
MONO%: 8.9 % (ref 0.0–14.0)
NEUT%: 48.8 % (ref 38.4–76.8)
NEUTROS ABS: 3.5 10*3/uL (ref 1.5–6.5)
PLATELETS: 257 10*3/uL (ref 145–400)
RBC: 4.37 10*6/uL (ref 3.70–5.45)
RDW: 13.5 % (ref 11.2–14.5)
WBC: 7.2 10*3/uL (ref 3.9–10.3)

## 2015-01-26 LAB — COMPREHENSIVE METABOLIC PANEL
ALT: 28 U/L (ref 0–55)
ANION GAP: 12 meq/L — AB (ref 3–11)
AST: 25 U/L (ref 5–34)
Albumin: 4.2 g/dL (ref 3.5–5.0)
Alkaline Phosphatase: 72 U/L (ref 40–150)
BILIRUBIN TOTAL: 0.39 mg/dL (ref 0.20–1.20)
BUN: 16.7 mg/dL (ref 7.0–26.0)
CALCIUM: 10.4 mg/dL (ref 8.4–10.4)
CO2: 24 meq/L (ref 22–29)
CREATININE: 1.2 mg/dL — AB (ref 0.6–1.1)
Chloride: 107 mEq/L (ref 98–109)
EGFR: 51 mL/min/{1.73_m2} — ABNORMAL LOW (ref >90)
Glucose: 85 mg/dl (ref 70–140)
Potassium: 4.1 mEq/L (ref 3.5–5.1)
Sodium: 143 mEq/L (ref 136–145)
TOTAL PROTEIN: 8 g/dL (ref 6.4–8.3)

## 2015-01-26 MED ORDER — TAMOXIFEN CITRATE 20 MG PO TABS: 20.0000 mg | ORAL_TABLET | Freq: Every day | ORAL | Status: DC

## 2015-01-26 NOTE — Progress Notes (Signed)
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 Patient was seen in the Breast Clinic for discussion of her treatment options.   STAGE:  Right breast  TisNx (stage0) DCIS. Low grade  ER+/PR+   PRIOR THERAPY: #1on screening mammography to have a nodule in the right breast on screening mammogram in December.She underwent ultrasound-guided core needle biopsy on 02/19/2012. That demonstrated DCIS with calcifications, involving a papilloma. It is estimated the DCIS was 0.3 cm and was low grade with no necrosis. The DCIS was 100% ER and PR positive  #2 She underwent MRI of her breasts on 03/08/2012. This demonstrated a 3.9 x 1.4 x 1.3 cm area of triangular shaped and linear low grade enhancement in the outer right breast, slightly superiorly, most consistent with post biopsy enhancement and hemorrhage  #3 She underwent lumpectomy on 03/18/2012. The specimen demonstrated atypical ductal hyperplasia but no residual malignancy  #4 patient was subsequently seen by me in February 2014 for discussion of adjuvant therapy. We had discussion regarding use of tamoxifen 20 mg as chemoprevention for DCIS and atypical ductal hyperplasia. She has been on this now for about 3 months time. A total of 5 years of therapy is planned.   CURRENT THERAPY:tamoxifen 20 mg since February 2014  INTERVAL HISTORY: Alexandra Sloan returns for follow-up. [name], this is my first encounter with her. She has been doing fairly well, no particular complaints. She has a mild arthritis related to joint pain, but no limitation of her activities. She denies any other significant pain, dyspnea, GI or other symptoms. She lives independently, was  brought in by her son today. She has been compliant with tamoxifen, and tolerates very well without noticeable side effects.  REVIEW OF SYSTEMS:   Constitutional: Denies fevers, chills or abnormal weight loss Eyes: Denies blurriness of vision Ears, nose, mouth, throat, and face: Denies mucositis or sore throat Respiratory: Denies cough, dyspnea or wheezes Cardiovascular: Denies palpitation, chest discomfort or lower extremity swelling Gastrointestinal:  Denies nausea, heartburn or change in bowel habits Skin: Denies abnormal skin rashes Lymphatics: Denies new lymphadenopathy or easy bruising Neurological:Denies numbness, tingling or new weaknesses Behavioral/Psych: Mood is stable, no new changes  All other systems were reviewed with the patient and are negative.  MEDICAL HISTORY:  Past Medical History  Diagnosis Date  . Pure hypercholesterolemia   . Benign essential hypertension   . Esophageal reflux   . Anosmia     Loss of smell  . Breast cancer ([place]) 03/18/2012    Right Breast    SURGICAL HISTORY: Past Surgical History  Procedure Laterality Date  . Cataract extraction    . Abdominal hysterectomy  03/1997  . Cholecystectomy    . Appendectomy    . Colonoscopy    . Partial mastectomy with needle localization  03/18/2012    Procedure: PARTIAL MASTECTOMY WITH NEEDLE LOCALIZATION;  Surgeon: Adin Hector, MD;  Location: [facility];  Service: General;  Laterality: Right;  . Breast, needle core biopsy  02/13/2012    Ductal Carcinoma In-Situ/ Upper Outer Quadrant    I have reviewed the social history and family history with the patient and they are unchanged from previous note.  Health Maintenance  Mammogram: 03/31/2014 Colonoscopy: 2008,  10 year follow up recommended Bone Density Scan: never Pap Smear: doesn't have anymore Eye Exam: 2014 Lipid Panel:unsure  ALLERGIES:  is allergic to tape.  MEDICATIONS:  Current Outpatient Prescriptions  Medication Sig  Dispense Refill  . Calcium Carb-Cholecalciferol (CALCIUM + D3) 600-200 MG-UNIT TABS Take by mouth.    . cholecalciferol (VITAMIN D) 1000 UNITS tablet Take 1,000 Units by mouth daily.  3  . tamoxifen (NOLVADEX) 20 MG tablet Take 1 tablet (20 mg total) by mouth daily. 90 tablet 0  . valsartan-hydrochlorothiazide (DIOVAN-HCT) 80-12.5 MG per tablet      No current facility-administered medications for this visit.    PHYSICAL EXAMINATION: ECOG PERFORMANCE STATUS: 0 - Asymptomatic  Filed Vitals:   01/26/15 1032  BP: 165/76  Pulse: 77  Temp: 98.4 F (36.9 C)  Resp: 18   Filed Weights   01/26/15 1032  Weight: 143 lb (64.864 kg)    GENERAL:alert, no distress and comfortable SKIN: skin color, texture, turgor are normal, no rashes or significant lesions EYES: normal, Conjunctiva are pink and non-injected, sclera clear OROPHARYNX:no exudate, no erythema and lips, buccal mucosa, and tongue normal  NECK: supple, thyroid normal size, non-tender, without nodularity LYMPH:  no palpable lymphadenopathy in the cervical, axillary or inguinal LUNGS: clear to auscultation and percussion with normal breathing effort HEART: regular rate & rhythm and no murmurs and no lower extremity edema ABDOMEN:abdomen soft, non-tender and normal bowel sounds Musculoskeletal:no cyanosis of digits and no clubbing  NEURO: alert & oriented x 3 with fluent speech, no focal motor/sensory deficits Breasts: Breast inspection showed them to be symmetrical with no nipple discharge. (+) well healed surgical scar in right breast. Palpation of the breasts and axilla revealed no obvious mass that I could appreciate.   LABORATORY DATA:  I have reviewed the data as listed CBC Latest Ref Rng 01/26/2015 01/13/2014 07/15/2013  WBC 3.9 - 10.3 10e3/uL 7.2 7.6 7.4  Hemoglobin 11.6 - 15.9 g/dL 13.4 13.0 12.7  Hematocrit 34.8 - 46.6 % 40.3 38.2 38.0  Platelets 145 - 400 10e3/uL 257 262 243     CMP Latest Ref Rng 01/26/2015  01/13/2014 07/15/2013  Glucose 70 - 140 mg/dl 85 99 94  BUN 7.0 - 26.0 mg/dL 16.7 15.4 13.9  Creatinine 0.6 - 1.1 mg/dL 1.2(H) 1.1 0.9  Sodium 136 - 145 mEq/L 143 140 139  Potassium 3.5 - 5.1 mEq/L 4.1 3.8 3.3(L)  Chloride 98 - 107 mEq/L - - -  CO2 22 - 29 mEq/L 24 22 22   Calcium 8.4 - 10.4 mg/dL 10.4 9.9 9.8  Total Protein 6.4 - 8.3 g/dL 8.0 7.7 7.5  Total Bilirubin 0.20 - 1.20 mg/dL 0.39 0.34 0.44  Alkaline Phos 40 - 150 U/L 72 65 50  AST 5 - 34 U/L 25 25 21   ALT 0 - 55 U/L 28 30 22       RADIOGRAPHIC STUDIES: I have personally reviewed the radiological images as listed and agreed with the findings in the report. IMPRESSION: No evidence of breast malignancy.  Right breast scarring.  RECOMMENDATION: Bilateral diagnostic mammograms in 1 year.  ASSESSMENT & PLAN: 80 year old post menopausal woman  1. Right upper outer quadrant breast DCIS, ER/PR positive, low grade -She is status post lumpectomy, on adjuvant tamoxifen. -Her DCIS has been cured by surgical resection, she is on tamoxifen for cancer prevention. -She has been tolerating tamoxifen fairly well, no noticeable side effects, we'll continue for total of 5 years -We reviewed her surveillance plan again, we'll continue annual mammogram,  I encouraged her to do self exam, and the CNS every 6 months. -I also encouraging her to have healthy diet and exercise regularly. She is fairly active at home. -Lab reviewed, result discussed with her. Her last mammogram and today's physical exam reviewed no evidence of disease.  2. Bone health  -She never had a bone density scan, not interested -We discussed that tamoxifen may stress herbal -She is on calcium and vitamin D once daily, I encourage her to continue  3. She'll continue follow-up with her primary care physician for other health related issues  Plan: -Continue tamoxifen -I'll see her back in 6 months with lab and exam  All questions were answered. The patient knows to  call the clinic with any problems, questions or concerns. No barriers to learning was detected. I spent 25 minutes counseling the patient face to face. The total time spent in the appointment was 30 minutes and more than 50% was on counseling and review of test results     Truitt Merle, MD 01/26/2015 11:14 AM

## 2015-01-29 ENCOUNTER — Telehealth: Payer: Self-pay | Admitting: Hematology

## 2015-01-29 NOTE — Telephone Encounter (Signed)
per pof to sch pt mamma-cld & gave pt time & date for mamma

## 2015-04-05 DIAGNOSIS — Z853 Personal history of malignant neoplasm of breast: Secondary | ICD-10-CM | POA: Insufficient documentation

## 2015-04-05 DIAGNOSIS — E559 Vitamin D deficiency, unspecified: Secondary | ICD-10-CM | POA: Insufficient documentation

## 2015-04-20 ENCOUNTER — Ambulatory Visit
Admission: RE | Admit: 2015-04-20 | Discharge: 2015-04-20 | Disposition: A | Discharge status: Home / Self Care | Payer: Medicare Other | Source: Ambulatory Visit | Attending: Hematology | Admitting: Hematology

## 2015-04-20 DIAGNOSIS — C50411 Malignant neoplasm of upper-outer quadrant of right female breast: Secondary | ICD-10-CM

## 2015-07-16 ENCOUNTER — Telehealth: Payer: Self-pay | Admitting: Hematology

## 2015-07-16 NOTE — Telephone Encounter (Signed)
pt called to r/s appt....pt ok and aware of new d.t °

## 2015-07-27 ENCOUNTER — Ambulatory Visit: Payer: Medicare Other | Admitting: Hematology

## 2015-07-27 ENCOUNTER — Other Ambulatory Visit: Payer: Medicare Other

## 2015-08-24 ENCOUNTER — Other Ambulatory Visit (HOSPITAL_BASED_OUTPATIENT_CLINIC_OR_DEPARTMENT_OTHER): Payer: Medicare Other

## 2015-08-24 ENCOUNTER — Encounter: Payer: Self-pay | Admitting: Hematology

## 2015-08-24 ENCOUNTER — Telehealth: Payer: Self-pay | Admitting: Hematology

## 2015-08-24 ENCOUNTER — Ambulatory Visit (HOSPITAL_BASED_OUTPATIENT_CLINIC_OR_DEPARTMENT_OTHER): Payer: Medicare Other | Admitting: Hematology

## 2015-08-24 VITALS — BP 159/74 | HR 73 | Temp 97.8°F | Resp 18 | Ht 62.0 in | Wt 145.9 lb

## 2015-08-24 DIAGNOSIS — Z79810 Long term (current) use of selective estrogen receptor modulators (SERMs): Secondary | ICD-10-CM | POA: Diagnosis not present

## 2015-08-24 DIAGNOSIS — Z170 Estrogen receptor positive status [ER+]: Secondary | ICD-10-CM | POA: Diagnosis not present

## 2015-08-24 DIAGNOSIS — D0511 Intraductal carcinoma in situ of right breast: Secondary | ICD-10-CM | POA: Diagnosis not present

## 2015-08-24 DIAGNOSIS — C50419 Malignant neoplasm of upper-outer quadrant of unspecified female breast: Secondary | ICD-10-CM

## 2015-08-24 DIAGNOSIS — C50411 Malignant neoplasm of upper-outer quadrant of right female breast: Secondary | ICD-10-CM

## 2015-08-24 LAB — CBC WITH DIFFERENTIAL/PLATELET
BASO%: 0.5 % (ref 0.0–2.0)
Basophils Absolute: 0 10*3/uL (ref 0.0–0.1)
EOS ABS: 0.2 10*3/uL (ref 0.0–0.5)
EOS%: 2.8 % (ref 0.0–7.0)
HCT: 39.9 % (ref 34.8–46.6)
HEMOGLOBIN: 13.2 g/dL (ref 11.6–15.9)
LYMPH%: 41.6 % (ref 14.0–49.7)
MCH: 30.5 pg (ref 25.1–34.0)
MCHC: 33.2 g/dL (ref 31.5–36.0)
MCV: 92 fL (ref 79.5–101.0)
MONO#: 0.7 10*3/uL (ref 0.1–0.9)
MONO%: 10.2 % (ref 0.0–14.0)
NEUT%: 44.9 % (ref 38.4–76.8)
NEUTROS ABS: 3.2 10*3/uL (ref 1.5–6.5)
PLATELETS: 227 10*3/uL (ref 145–400)
RBC: 4.34 10*6/uL (ref 3.70–5.45)
RDW: 13.4 % (ref 11.2–14.5)
WBC: 7.1 10*3/uL (ref 3.9–10.3)
lymph#: 3 10*3/uL (ref 0.9–3.3)

## 2015-08-24 LAB — COMPREHENSIVE METABOLIC PANEL
ALBUMIN: 4.1 g/dL (ref 3.5–5.0)
ALK PHOS: 67 U/L (ref 40–150)
ALT: 36 U/L (ref 0–55)
AST: 29 U/L (ref 5–34)
Anion Gap: 11 mEq/L (ref 3–11)
BILIRUBIN TOTAL: 0.38 mg/dL (ref 0.20–1.20)
BUN: 17.8 mg/dL (ref 7.0–26.0)
CO2: 25 meq/L (ref 22–29)
CREATININE: 1.2 mg/dL — AB (ref 0.6–1.1)
Calcium: 10.6 mg/dL — ABNORMAL HIGH (ref 8.4–10.4)
Chloride: 107 mEq/L (ref 98–109)
EGFR: 52 mL/min/{1.73_m2} — ABNORMAL LOW (ref >90)
GLUCOSE: 88 mg/dL (ref 70–140)
Potassium: 4.4 mEq/L (ref 3.5–5.1)
SODIUM: 143 meq/L (ref 136–145)
TOTAL PROTEIN: 7.8 g/dL (ref 6.4–8.3)

## 2015-08-24 NOTE — Progress Notes (Signed)
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 Patient was seen in the Breast Clinic for discussion of her treatment options.   STAGE:  Right breast  TisNx (stage0) and no DCIS. Low grade  ER+/PR+   PRIOR THERAPY: #1on screening mammography to have a nodule in the right breast on screening mammogram in December.She underwent ultrasound-guided core needle biopsy on 02/19/2012. That demonstrated DCIS with calcifications, involving a papilloma. It is estimated the DCIS was 0.3 cm and was low grade with no necrosis. The DCIS was 100% ER and PR positive  #2 She underwent MRI of her breasts on 03/08/2012. This demonstrated a 3.9 x 1.4 x 1.3 cm area of triangular shaped and linear low grade enhancement in the outer right breast, slightly superiorly, most consistent with post biopsy enhancement and hemorrhage  #3 She underwent lumpectomy on 03/18/2012. The specimen demonstrated atypical ductal hyperplasia but no residual malignancy  #4 patient was subsequently seen by me in February 2014 for discussion of adjuvant therapy. We had discussion regarding use of tamoxifen 20 mg as chemoprevention for DCIS and atypical ductal hyperplasia. She has been on this now for about 3 months time. A total of 5 years of therapy is planned.   CURRENT THERAPY:tamoxifen 20 mg since February 2014  INTERVAL HISTORY: Alexandra Sloan returns for follow-up. She was last seen by me 6 months ago. She is doing very well, denies any pain, or other symptoms. She has good appetite and energy level. She is compliant with tamoxifen, tolerating well, thrush or other noticeable side effects. Her primary care physician increased her vitamin D level lately, and she is taking calcium also.   REVIEW  OF SYSTEMS:   Constitutional: Denies fevers, chills or abnormal weight loss Eyes: Denies blurriness of vision Ears, nose, mouth, throat, and face: Denies mucositis or sore throat Respiratory: Denies cough, dyspnea or wheezes Cardiovascular: Denies palpitation, chest discomfort or lower extremity swelling Gastrointestinal:  Denies nausea, heartburn or change in bowel habits Skin: Denies abnormal skin rashes Lymphatics: Denies new lymphadenopathy or easy bruising Neurological:Denies numbness, tingling or new weaknesses Behavioral/Psych: Mood is stable, no new changes  All other systems were reviewed with the patient and are negative.  MEDICAL HISTORY:  Past Medical History  Diagnosis Date  . Pure hypercholesterolemia   . Benign essential hypertension   . Esophageal reflux   . Anosmia     Loss of smell  . Breast cancer ([place]) 03/18/2012    Right Breast    SURGICAL HISTORY: Past Surgical History  Procedure Laterality Date  . Cataract extraction    . Abdominal hysterectomy  03/1997  . Cholecystectomy    . Appendectomy    . Colonoscopy    . Partial mastectomy with needle localization  03/18/2012    Procedure: PARTIAL MASTECTOMY WITH NEEDLE LOCALIZATION;  Surgeon: Adin Hector, MD;  Location: [facility];  Service: General;  Laterality: Right;  . Breast, needle core biopsy  02/13/2012    Ductal Carcinoma In-Situ/ Upper Outer Quadrant    I have reviewed the social history and family history with the patient and they are unchanged from previous note.  Health Maintenance  Mammogram: 03/31/2014 Colonoscopy: 2008, 10 year follow up recommended Bone Density Scan: never Pap Smear: doesn't have anymore Eye Exam: 2014 Lipid  Panel:unsure  ALLERGIES:  is allergic to tape.  MEDICATIONS:  Current Outpatient Prescriptions  Medication Sig Dispense Refill  . Calcium Carbonate-Vitamin D (CALCIUM 600+D) 600-400 MG-UNIT tablet Take 1 tablet by mouth 2 (two) times daily.      . Cholecalciferol (VITAMIN D3) 5000 units TABS Take 5 tablets by mouth once a week.    . tamoxifen (NOLVADEX) 20 MG tablet Take 1 tablet (20 mg total) by mouth daily. 90 tablet 2  . valsartan-hydrochlorothiazide (DIOVAN-HCT) 80-12.5 MG per tablet      No current facility-administered medications for this visit.    PHYSICAL EXAMINATION: ECOG PERFORMANCE STATUS: 0 - Asymptomatic  Filed Vitals:   08/24/15 1039  BP: 159/74  Pulse: 73  Temp: 97.8 F (36.6 C)  Resp: 18   Filed Weights   08/24/15 1039  Weight: 145 lb 14.4 oz (66.18 kg)    GENERAL:alert, no distress and comfortable SKIN: skin color, texture, turgor are normal, no rashes or significant lesions EYES: normal, Conjunctiva are pink and non-injected, sclera clear OROPHARYNX:no exudate, no erythema and lips, buccal mucosa, and tongue normal  NECK: supple, thyroid normal size, non-tender, without nodularity LYMPH:  no palpable lymphadenopathy in the cervical, axillary or inguinal LUNGS: clear to auscultation and percussion with normal breathing effort HEART: regular rate & rhythm and no murmurs and no lower extremity edema ABDOMEN:abdomen soft, non-tender and normal bowel sounds Musculoskeletal:no cyanosis of digits and no clubbing  NEURO: alert & oriented x 3 with fluent speech, no focal motor/sensory deficits Breasts: Breast inspection showed them to be symmetrical with no nipple discharge. (+) well healed surgical scar in right breast. Palpation of the breasts and axilla revealed no obvious mass that I could appreciate.   LABORATORY DATA:  I have reviewed the data as listed CBC Latest Ref Rng 08/24/2015 01/26/2015 01/13/2014  WBC 3.9 - 10.3 10e3/uL 7.1 7.2 7.6  Hemoglobin 11.6 - 15.9 g/dL 13.2 13.4 13.0  Hematocrit 34.8 - 46.6 % 39.9 40.3 38.2  Platelets 145 - 400 10e3/uL 227 257 262     CMP Latest Ref Rng 08/24/2015 01/26/2015 01/13/2014  Glucose 70 - 140 mg/dl 88 85 99  BUN 7.0 - 26.0 mg/dL 17.8 16.7 15.4   Creatinine 0.6 - 1.1 mg/dL 1.2(H) 1.2(H) 1.1  Sodium 136 - 145 mEq/L 143 143 140  Potassium 3.5 - 5.1 mEq/L 4.4 4.1 3.8  CO2 22 - 29 mEq/L 25 24 22   Calcium 8.4 - 10.4 mg/dL 10.6(H) 10.4 9.9  Total Protein 6.4 - 8.3 g/dL 7.8 8.0 7.7  Total Bilirubin 0.20 - 1.20 mg/dL 0.38 0.39 0.34  Alkaline Phos 40 - 150 U/L 67 72 65  AST 5 - 34 U/L 29 25 25   ALT 0 - 55 U/L 36 28 30      RADIOGRAPHIC STUDIES: I have personally reviewed the radiological images as listed and agreed with the findings in the report.  MM Diag breast TOMO bilateral 04/20/2015 IMPRESSION: No evidence of recurrent or new breast malignancy. Benign postsurgical changes on the right.  RECOMMENDATION: Diagnostic mammography in 1 year per standard post lumpectomy protocol PE  ASSESSMENT & PLAN: 81 year old post menopausal woman  1. Right upper outer quadrant breast DCIS, ER/PR positive, low grade -She is status post lumpectomy, on adjuvant tamoxifen. -Her DCIS has been cured by surgical resection, she is on tamoxifen for cancer prevention. -She has been tolerating tamoxifen fairly well, no noticeable side effects, we'll continue for total of 5 years -We reviewed her surveillance plan again, we'll continue annual  mammogram, I encouraged her to do self exam, and the CNS every 6 months. -I also encouraging her to have healthy diet and exercise regularly. She is fairly active at home. -Lab reviewed, result discussed with her. Her last mammogram and today's physical exam reviewed no evidence of disease.  2. Bone health  -She had a bone density scan at her primary care physician's office lately -We discussed that tamoxifen may strength her bone  -She is on calcium and vitamin D, I encourage her to continue  3. She'll continue follow-up with her primary care physician for other health related issues  Plan: -Continue tamoxifen -I'll see her back in 6 months with lab and exam  All questions were answered. The patient  knows to call the clinic with any problems, questions or concerns. No barriers to learning was detected. I spent 15 minutes counseling the patient face to face. The total time spent in the appointment was 20 minutes and more than 50% was on counseling and review of test results     Truitt Merle, MD 08/24/2015 11:26 AM

## 2015-08-24 NOTE — Telephone Encounter (Signed)
cld & spoke to pt and adv of time & date of appt-pt stated she did not need it now she will call back

## 2016-01-01 ENCOUNTER — Telehealth: Payer: Self-pay | Admitting: Hematology

## 2016-01-01 NOTE — Telephone Encounter (Signed)
Returned call to patient. No message left with reason for return call. Left voicemail message confirming next scheduled appointments for the patient and to return call with any further questions and concerns.

## 2016-01-03 ENCOUNTER — Other Ambulatory Visit: Payer: Self-pay | Admitting: Hematology

## 2016-01-03 DIAGNOSIS — C50411 Malignant neoplasm of upper-outer quadrant of right female breast: Secondary | ICD-10-CM

## 2016-02-08 ENCOUNTER — Telehealth: Payer: Self-pay | Admitting: Hematology

## 2016-02-08 NOTE — Telephone Encounter (Signed)
S/w pt to advise of appt chg from 12/29 to 03/07/16 @ 1pm. Pt says she has been trying to call us to schedule an appt for 12/29. I advised pt that she already had an appt on 12/29 but that it needed to be changed due to md pal. Gave pt appt for 03/07/16. She says she will call back if her ride can't bring her.

## 2016-02-22 ENCOUNTER — Other Ambulatory Visit: Payer: Medicare Other

## 2016-02-22 ENCOUNTER — Ambulatory Visit: Payer: Medicare Other | Admitting: Hematology

## 2016-03-07 ENCOUNTER — Ambulatory Visit (HOSPITAL_BASED_OUTPATIENT_CLINIC_OR_DEPARTMENT_OTHER): Payer: Medicare Other | Admitting: Hematology

## 2016-03-07 ENCOUNTER — Encounter: Payer: Self-pay | Admitting: Hematology

## 2016-03-07 ENCOUNTER — Other Ambulatory Visit (HOSPITAL_BASED_OUTPATIENT_CLINIC_OR_DEPARTMENT_OTHER): Payer: Medicare Other

## 2016-03-07 VITALS — BP 164/73 | HR 76 | Temp 98.6°F | Resp 18 | Ht 62.0 in | Wt 145.8 lb

## 2016-03-07 DIAGNOSIS — Z79810 Long term (current) use of selective estrogen receptor modulators (SERMs): Secondary | ICD-10-CM

## 2016-03-07 DIAGNOSIS — C50411 Malignant neoplasm of upper-outer quadrant of right female breast: Secondary | ICD-10-CM

## 2016-03-07 DIAGNOSIS — Z170 Estrogen receptor positive status [ER+]: Principal | ICD-10-CM

## 2016-03-07 DIAGNOSIS — D0511 Intraductal carcinoma in situ of right breast: Secondary | ICD-10-CM

## 2016-03-07 LAB — COMPREHENSIVE METABOLIC PANEL
ALBUMIN: 3.9 g/dL (ref 3.5–5.0)
ALK PHOS: 67 U/L (ref 40–150)
ALT: 22 U/L (ref 0–55)
AST: 21 U/L (ref 5–34)
Anion Gap: 11 mEq/L (ref 3–11)
BILIRUBIN TOTAL: 0.39 mg/dL (ref 0.20–1.20)
BUN: 18.7 mg/dL (ref 7.0–26.0)
CALCIUM: 9.9 mg/dL (ref 8.4–10.4)
CO2: 25 mEq/L (ref 22–29)
CREATININE: 1 mg/dL (ref 0.6–1.1)
Chloride: 106 mEq/L (ref 98–109)
EGFR: 62 mL/min/{1.73_m2} — ABNORMAL LOW (ref >90)
GLUCOSE: 78 mg/dL (ref 70–140)
Potassium: 3.4 mEq/L — ABNORMAL LOW (ref 3.5–5.1)
Sodium: 141 mEq/L (ref 136–145)
TOTAL PROTEIN: 7.3 g/dL (ref 6.4–8.3)

## 2016-03-07 LAB — CBC WITH DIFFERENTIAL/PLATELET
BASO%: 0.6 % (ref 0.0–2.0)
BASOS ABS: 0.1 10*3/uL (ref 0.0–0.1)
EOS%: 2.3 % (ref 0.0–7.0)
Eosinophils Absolute: 0.2 10*3/uL (ref 0.0–0.5)
HEMATOCRIT: 34.9 % (ref 34.8–46.6)
HEMOGLOBIN: 12.1 g/dL (ref 11.6–15.9)
LYMPH#: 3.2 10*3/uL (ref 0.9–3.3)
LYMPH%: 40.5 % (ref 14.0–49.7)
MCH: 31.9 pg (ref 25.1–34.0)
MCHC: 34.7 g/dL (ref 31.5–36.0)
MCV: 91.7 fL (ref 79.5–101.0)
MONO#: 0.7 10*3/uL (ref 0.1–0.9)
MONO%: 9.1 % (ref 0.0–14.0)
NEUT%: 47.5 % (ref 38.4–76.8)
NEUTROS ABS: 3.7 10*3/uL (ref 1.5–6.5)
Platelets: 230 10*3/uL (ref 145–400)
RBC: 3.81 10*6/uL (ref 3.70–5.45)
RDW: 13.1 % (ref 11.2–14.5)
WBC: 7.8 10*3/uL (ref 3.9–10.3)

## 2016-03-07 NOTE — Progress Notes (Signed)
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 Patient was seen in the Breast Clinic for discussion of her treatment options.   STAGE:  Right breast  TisNx (stage0) and no DCIS. Low grade  ER+/PR+   PRIOR THERAPY: #1on screening mammography to have a nodule in the right breast on screening mammogram in December.She underwent ultrasound-guided core needle biopsy on 02/19/2012. That demonstrated DCIS with calcifications, involving a papilloma. It is estimated the DCIS was 0.3 cm and was low grade with no necrosis. The DCIS was 100% ER and PR positive  #2 She underwent MRI of her breasts on 03/08/2012. This demonstrated a 3.9 x 1.4 x 1.3 cm area of triangular shaped and linear low grade enhancement in the outer right breast, slightly superiorly, most consistent with post biopsy enhancement and hemorrhage  #3 She underwent lumpectomy on 03/18/2012. The specimen demonstrated atypical ductal hyperplasia but no residual malignancy  #4 patient was subsequently seen by me in February 2014 for discussion of adjuvant therapy. We had discussion regarding use of tamoxifen 20 mg as chemoprevention for DCIS and atypical ductal hyperplasia. She has been on this now for about 3 months time. A total of 5 years of therapy is planned.   CURRENT THERAPY:tamoxifen 20 mg since February 2014  INTERVAL HISTORY: Alexandra Sloan returns for follow-up. She was last seen by me 6 months ago. She is doing very well, denies any pain, dyspnea, or other complaints. She has good appetite, eats and drinks fluids well. She remains to be physically active, functions very well at home. No other complaints. She tolerates tamoxifen well without any noticeable side effects.   REVIEW OF  SYSTEMS:   Constitutional: Denies fevers, chills or abnormal weight loss Eyes: Denies blurriness of vision Ears, nose, mouth, throat, and face: Denies mucositis or sore throat Respiratory: Denies cough, dyspnea or wheezes Cardiovascular: Denies palpitation, chest discomfort or lower extremity swelling Gastrointestinal:  Denies nausea, heartburn or change in bowel habits Skin: Denies abnormal skin rashes Lymphatics: Denies new lymphadenopathy or easy bruising Neurological:Denies numbness, tingling or new weaknesses Behavioral/Psych: Mood is stable, no new changes  All other systems were reviewed with the patient and are negative.  MEDICAL HISTORY:  Past Medical History:  Diagnosis Date  . Anosmia    Loss of smell  . Benign essential hypertension   . Breast cancer ([place]) 03/18/2012   Right Breast  . Esophageal reflux   . Pure hypercholesterolemia     SURGICAL HISTORY: Past Surgical History:  Procedure Laterality Date  . ABDOMINAL HYSTERECTOMY  03/1997  . APPENDECTOMY    . Breast, Needle Core Biopsy  02/13/2012   Ductal Carcinoma In-Situ/ Upper Outer Quadrant  . CATARACT EXTRACTION    . CHOLECYSTECTOMY    . COLONOSCOPY    . PARTIAL MASTECTOMY WITH NEEDLE LOCALIZATION  03/18/2012   Procedure: PARTIAL MASTECTOMY WITH NEEDLE LOCALIZATION;  Surgeon: Adin Hector, MD;  Location: [place] [facility];  Service: General;  Laterality: Right;    I have reviewed the social history and family history with the patient and they are unchanged from previous note.  Health Maintenance  Mammogram: 04/20/2015 Colonoscopy: 2008, 10 year follow up recommended Bone Density Scan: never Pap Smear: doesn't have anymore Eye Exam: 2014 Lipid Panel:unsure  ALLERGIES:  is allergic to  tape.  MEDICATIONS:  Current Outpatient Prescriptions  Medication Sig Dispense Refill  . Calcium Carbonate-Vitamin D (CALCIUM 600+D) 600-400 MG-UNIT tablet Take 1 tablet by mouth 2 (two) times daily.    .  Cholecalciferol (VITAMIN D3) 5000 units TABS Take 5 tablets by mouth once a week.    . tamoxifen (NOLVADEX) 20 MG tablet TAKE 1 TABLET (20 MG TOTAL) BY MOUTH DAILY. 90 tablet 2  . valsartan-hydrochlorothiazide (DIOVAN-HCT) 80-12.5 MG per tablet      No current facility-administered medications for this visit.     PHYSICAL EXAMINATION: ECOG PERFORMANCE STATUS: 0 - Asymptomatic  Vitals:   03/07/16 1419  BP: (!) 164/73  Pulse: 76  Resp: 18  Temp: 98.6 F (37 C)   Filed Weights   03/07/16 1419  Weight: 145 lb 12.8 oz (66.1 kg)    GENERAL:alert, no distress and comfortable SKIN: skin color, texture, turgor are normal, no rashes or significant lesions (+) keloids in mid upper chest EYES: normal, Conjunctiva are pink and non-injected, sclera clear OROPHARYNX:no exudate, no erythema and lips, buccal mucosa, and tongue normal  NECK: supple, thyroid normal size, non-tender, without nodularity LYMPH:  no palpable lymphadenopathy in the cervical, axillary or inguinal LUNGS: clear to auscultation and percussion with normal breathing effort HEART: regular rate & rhythm and no murmurs and no lower extremity edema ABDOMEN:abdomen soft, non-tender and normal bowel sounds Musculoskeletal:no cyanosis of digits and no clubbing  NEURO: alert & oriented x 3 with fluent speech, no focal motor/sensory deficits Breasts: Breast inspection showed them to be symmetrical with no nipple discharge. (+) well healed surgical scar in right breast. Palpation of the breasts and axilla revealed no obvious mass that I could appreciate.   LABORATORY DATA:  I have reviewed the data as listed CBC Latest Ref Rng & Units 03/07/2016 08/24/2015 01/26/2015  WBC 3.9 - 10.3 10e3/uL 7.8 7.1 7.2  Hemoglobin 11.6 - 15.9 g/dL 12.1 13.2 13.4  Hematocrit 34.8 - 46.6 % 34.9 39.9 40.3  Platelets 145 - 400 10e3/uL 230 227 257     CMP Latest Ref Rng & Units 03/07/2016 08/24/2015 01/26/2015  Glucose 70 - 140 mg/dl 78 88 85  BUN 7.0  - 26.0 mg/dL 18.7 17.8 16.7  Creatinine 0.6 - 1.1 mg/dL 1.0 1.2(H) 1.2(H)  Sodium 136 - 145 mEq/L 141 143 143  Potassium 3.5 - 5.1 mEq/L 3.4(L) 4.4 4.1  Chloride 98 - 107 mEq/L - - -  CO2 22 - 29 mEq/L 25 25 24   Calcium 8.4 - 10.4 mg/dL 9.9 10.6(H) 10.4  Total Protein 6.4 - 8.3 g/dL 7.3 7.8 8.0  Total Bilirubin 0.20 - 1.20 mg/dL 0.39 0.38 0.39  Alkaline Phos 40 - 150 U/L 67 67 72  AST 5 - 34 U/L 21 29 25   ALT 0 - 55 U/L 22 36 28      RADIOGRAPHIC STUDIES: I have personally reviewed the radiological images as listed and agreed with the findings in the report.  MM Diag breast TOMO bilateral 04/20/2015 IMPRESSION: No evidence of recurrent or new breast malignancy. Benign postsurgical changes on the right.  RECOMMENDATION: Diagnostic mammography in 1 year per standard post lumpectomy protocol PE  ASSESSMENT & PLAN: 82 y.o. post menopausal woman  1. Right upper outer quadrant breast DCIS, ER/PR positive, low grade -She is status post lumpectomy, on adjuvant tamoxifen. -Her DCIS has been cured by surgical resection, she is on tamoxifen for cancer prevention. -She has been tolerating tamoxifen fairly well, no noticeable side effects, we'll continue for  total of 5 years, she'll complete in February 2019 -We reviewed her surveillance plan again, we'll continue annual mammogram, I encouraged her to do self exam, and see [place] routinely  -She is clinically doing very well. Lab reviewed, result discussed with her. Her last mammogram and today's physical exam are normal, no concerns of recurrence. -I encouraged her to continue to be physically active and eat healthy.  2. Bone health  -She had a bone density scan at her primary care physician's office -We discussed that tamoxifen may strength her bone  -She is on calcium and vitamin D, I encourage her to continue  3. She'll continue follow-up with her primary care physician for other health related issues  Plan: -Continue  tamoxifen -I'll see her back in 12 months with lab and exam  All questions were answered. The patient knows to call the clinic with any problems, questions or concerns. No barriers to learning was detected.  I spent 15 minutes counseling the patient face to face. The total time spent in the appointment was 20 minutes and more than 50% was on counseling and review of test results     Truitt Merle, MD 03/07/16 2:31 PM

## 2016-03-21 ENCOUNTER — Telehealth: Payer: Self-pay | Admitting: Hematology

## 2016-03-21 NOTE — Telephone Encounter (Signed)
Left a message on machine and if she had any questions to call us back

## 2016-04-14 ENCOUNTER — Ambulatory Visit
Admission: RE | Admit: 2016-04-14 | Discharge: 2016-04-14 | Disposition: A | Discharge status: Home / Self Care | Payer: Medicare Other | Source: Ambulatory Visit | Attending: Hematology | Admitting: Hematology

## 2016-04-14 DIAGNOSIS — Z170 Estrogen receptor positive status [ER+]: Principal | ICD-10-CM

## 2016-04-14 DIAGNOSIS — C50411 Malignant neoplasm of upper-outer quadrant of right female breast: Secondary | ICD-10-CM

## 2016-09-26 ENCOUNTER — Other Ambulatory Visit: Payer: Self-pay | Admitting: *Deleted

## 2016-09-26 DIAGNOSIS — C50411 Malignant neoplasm of upper-outer quadrant of right female breast: Secondary | ICD-10-CM

## 2016-09-26 MED ORDER — TAMOXIFEN CITRATE 20 MG PO TABS: 20.0000 mg | ORAL_TABLET | Freq: Every day | ORAL | 3 refills | Status: DC

## 2017-02-04 ENCOUNTER — Ambulatory Visit: Payer: Medicare Other | Admitting: Hematology

## 2017-02-04 ENCOUNTER — Other Ambulatory Visit: Payer: Medicare Other

## 2017-02-26 NOTE — Progress Notes (Signed)
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 Patient was seen in the Breast Clinic for discussion of her treatment options.   STAGE:  Right breast  TisNx (stage0) and no DCIS. Low grade  ER+/PR+   PRIOR THERAPY: #1on screening mammography to have a nodule in the right breast on screening mammogram in December.She underwent ultrasound-guided core needle biopsy on 02/19/2012. That demonstrated DCIS with calcifications, involving a papilloma. It is estimated the DCIS was 0.3 cm and was low grade with no necrosis. The DCIS was 100% ER and PR positive  #2 She underwent MRI of her breasts on 03/08/2012. This demonstrated a 3.9 x 1.4 x 1.3 cm area of triangular shaped and linear low grade enhancement in the outer right breast, slightly superiorly, most consistent with post biopsy enhancement and hemorrhage  #3 She underwent lumpectomy on 03/18/2012. The specimen demonstrated atypical ductal hyperplasia but no residual malignancy  #4 patient was subsequently seen by me in February 2014 for discussion of adjuvant therapy. We had discussion regarding use of tamoxifen 20 mg as chemoprevention for DCIS and atypical ductal hyperplasia. She has been on this now for about 3 months time. A total of 5 years of therapy is planned.   CURRENT THERAPY: tamoxifen 20 mg since February 2014-2019   INTERVAL HISTORY:  Alexandra Sloan returns for follow-up. She was last seen by me 12 months ago. She presents to the clinic today noting she has been doing well. She denies anything new since her last visit. She does have a slight cold currently but it is manageable. She will complete her Tamoxifen after this month. She has no issues with Tamoxifen. She notes  to doing regular breast exams. She is overall doing very well.      REVIEW OF SYSTEMS:   Constitutional: Denies fevers, chills or abnormal weight loss Eyes: Denies blurriness of vision Ears, nose, mouth, throat, and face: Denies mucositis or sore throat (+) cold symptoms  Respiratory: Denies cough, dyspnea or wheezes Cardiovascular: Denies palpitation, chest discomfort or lower extremity swelling Gastrointestinal:  Denies nausea, heartburn or change in bowel habits Skin: Denies abnormal skin rashes Lymphatics: Denies new lymphadenopathy or easy bruising Neurological:Denies numbness, tingling or new weaknesses Behavioral/Psych: Mood is stable, no new changes  All other systems were reviewed with the patient and are negative.  MEDICAL HISTORY:  Past Medical History:  Diagnosis Date  . Anosmia    Loss of smell  . Benign essential hypertension   . Breast cancer ([place]) 03/18/2012   Right Breast  . Esophageal reflux   . Pure hypercholesterolemia     SURGICAL HISTORY: Past Surgical History:  Procedure Laterality Date  . ABDOMINAL HYSTERECTOMY  03/1997  . APPENDECTOMY    . Breast, Needle Core Biopsy  02/13/2012   Ductal Carcinoma In-Situ/ Upper Outer Quadrant  . CATARACT EXTRACTION    . CHOLECYSTECTOMY    . COLONOSCOPY    . EXCISION / BIOPSY BREAST / NIPPLE / DUCT    . PARTIAL MASTECTOMY WITH NEEDLE LOCALIZATION  03/18/2012   Procedure: PARTIAL MASTECTOMY WITH NEEDLE LOCALIZATION;  Surgeon: Adin Hector, MD;  Location: [facility];  Service: General;  Laterality: Right;    I have reviewed the social history and family history with  the patient and they are unchanged from previous note.  Health Maintenance  Mammogram: 03/2016 Colonoscopy: 2008, 10 year follow up recommended Bone Density Scan: never Pap Smear: doesn't have anymore Eye Exam: 2014 Lipid Panel:unsure  ALLERGIES:  is allergic to tape.  MEDICATIONS:  Current Outpatient Medications  Medication  Sig Dispense Refill  . Calcium Carbonate-Vitamin D (CALCIUM 600+D) 600-400 MG-UNIT tablet Take 1 tablet by mouth 2 (two) times daily.    . Cholecalciferol (VITAMIN D3) 5000 units TABS Take 5 tablets by mouth once a week.    . tamoxifen (NOLVADEX) 20 MG tablet Take 1 tablet (20 mg total) by mouth daily. 90 tablet 3  . valsartan-hydrochlorothiazide (DIOVAN-HCT) 80-12.5 MG per tablet      No current facility-administered medications for this visit.     PHYSICAL EXAMINATION: ECOG PERFORMANCE STATUS: 0 - Asymptomatic  Vitals:   02/27/17 1251  BP: (!) 151/69  Pulse: 79  Resp: 20  Temp: 97.9 F (36.6 C)  SpO2: 100%   Filed Weights   02/27/17 1251  Weight: 144 lb (65.3 kg)    GENERAL:alert, no distress and comfortable SKIN: skin color, texture, turgor are normal, no rashes or significant lesions (+) keloids in mid upper chest EYES: normal, Conjunctiva are pink and non-injected, sclera clear OROPHARYNX:no exudate, no erythema and lips, buccal mucosa, and tongue normal  NECK: supple, thyroid normal size, non-tender, without nodularity LYMPH:  no palpable lymphadenopathy in the cervical, axillary or inguinal LUNGS: clear to auscultation and percussion with normal breathing effort HEART: regular rate & rhythm and no murmurs and no lower extremity edema ABDOMEN:abdomen soft, non-tender and normal bowel sounds Musculoskeletal:no cyanosis of digits and no clubbing  NEURO: alert & oriented x 3 with fluent speech, no focal motor/sensory deficits Breasts: Breast inspection showed them to be symmetrical with no nipple discharge. (+) well healed surgical scar in right breast with mild scar tissue. Palpation of the breasts and axilla revealed no obvious mass that I could appreciate.   LABORATORY DATA:  I have reviewed the data as listed CBC Latest Ref Rng & Units 02/27/2017 03/07/2016 08/24/2015  WBC 3.9 - 10.3 10e3/uL 7.2 7.8 7.1  Hemoglobin 11.6 - 15.9 g/dL 12.3 12.1 13.2  Hematocrit 34.8 -  46.6 % 36.7 34.9 39.9  Platelets 145 - 400 10e3/uL 223 230 227     CMP Latest Ref Rng & Units 02/27/2017 03/07/2016 08/24/2015  Glucose 70 - 140 mg/dl 88 78 88  BUN 7.0 - 26.0 mg/dL 16.2 18.7 17.8  Creatinine 0.6 - 1.1 mg/dL 1.1 1.0 1.2(H)  Sodium 136 - 145 mEq/L 141 141 143  Potassium 3.5 - 5.1 mEq/L 3.5 3.4(L) 4.4  Chloride 98 - 107 mEq/L - - -  CO2 22 - 29 mEq/L 26 25 25   Calcium 8.4 - 10.4 mg/dL 9.4 9.9 10.6(H)  Total Protein 6.4 - 8.3 g/dL 7.2 7.3 7.8  Total Bilirubin 0.20 - 1.20 mg/dL 0.35 0.39 0.38  Alkaline Phos 40 - 150 U/L 69 67 67  AST 5 - 34 U/L 37(H) 21 29  ALT 0 - 55 U/L 47 22 36      RADIOGRAPHIC STUDIES: I have personally reviewed the radiological images as listed and agreed with the findings in the report.  Diagnostic Mammogram Bilateral 04/14/16 IMPRESSION: No evidence of malignancy in either breast. Lumpectomy changes on the right. RECOMMENDATION: Diagnostic mammogram is suggested in 1 year. (Code:DM-B-01Y)  MM Diag breast TOMO bilateral 04/20/2015 IMPRESSION: No evidence of recurrent or new breast malignancy. Benign postsurgical changes  on the right.  RECOMMENDATION: Diagnostic mammography in 1 year per standard post lumpectomy protocol PE  ASSESSMENT & PLAN: 83 y.o. post menopausal woman  1. Right upper outer quadrant breast DCIS, ER/PR positive, low grade -She is status post lumpectomy, on adjuvant tamoxifen started in 03/2012 -Her DCIS has been cured by surgical resection, she is on tamoxifen for cancer prevention. -She has been tolerating tamoxifen fairly well, no noticeable side effects, we'll continue for total of 5 years, she'll complete in February 2019 -We reviewed her surveillance plan again, we'll continue annual mammogram, I encouraged her to do self exam, and see [place] routinely  -I encouraged her to continue to be physically active and eat healthy. -She will complete her 5 yearTamoxifen after this current month -She is clinically doing  well. Lab reviewed, CBC within normal limits. Her physical exam and her 03/2016 mammogram were unremarkable. There is no clinical concern for recurrence. -She has completed her 5 year follow up. She will continue surveillance. Next mammogram in 03/2017. F/u in 1 year.   2. Bone health  -She had a bone density scan at her primary care physician's office -We discussed that tamoxifen may strength her bone  -She is on calcium and vitamin D, I encourage her to continue  3. She'll continue follow-up with her primary care physician for other health related issues  Plan:  -Complete Tamoxifen this month, then discontinue  -Mammogram in 03/2017 -F/u in 12 months for exam   All questions were answered. The patient knows to call the clinic with any problems, questions or concerns. No barriers to learning was detected.  I spent 15 minutes counseling the patient face to face. The total time spent in the appointment was 20 minutes and more than 50% was on counseling and review of test results     Truitt Merle, MD 02/27/2017  11:50 PM  This document serves as a record of services personally performed by Truitt Merle, MD. It was created on her behalf by Joslyn Devon, a trained medical scribe. The creation of this record is based on the scribe's personal observations and the provider's statements to them.    I have reviewed the above documentation for accuracy and completeness, and I agree with the above.

## 2017-02-27 ENCOUNTER — Encounter: Payer: Self-pay | Admitting: Hematology

## 2017-02-27 ENCOUNTER — Other Ambulatory Visit (HOSPITAL_BASED_OUTPATIENT_CLINIC_OR_DEPARTMENT_OTHER): Payer: Medicare Other

## 2017-02-27 ENCOUNTER — Inpatient Hospital Stay: Payer: Medicare Other | Attending: Hematology | Admitting: Hematology

## 2017-02-27 VITALS — BP 151/69 | HR 79 | Temp 97.9°F | Resp 20 | Ht 62.0 in | Wt 144.0 lb

## 2017-02-27 DIAGNOSIS — Z79810 Long term (current) use of selective estrogen receptor modulators (SERMs): Secondary | ICD-10-CM | POA: Diagnosis not present

## 2017-02-27 DIAGNOSIS — C50411 Malignant neoplasm of upper-outer quadrant of right female breast: Secondary | ICD-10-CM

## 2017-02-27 DIAGNOSIS — Z170 Estrogen receptor positive status [ER+]: Principal | ICD-10-CM

## 2017-02-27 DIAGNOSIS — D0511 Intraductal carcinoma in situ of right breast: Secondary | ICD-10-CM

## 2017-02-27 LAB — CBC WITH DIFFERENTIAL/PLATELET
BASO%: 1 % (ref 0.0–2.0)
BASOS ABS: 0.1 10*3/uL (ref 0.0–0.1)
EOS ABS: 0.4 10*3/uL (ref 0.0–0.5)
EOS%: 5.4 % (ref 0.0–7.0)
HCT: 36.7 % (ref 34.8–46.6)
HGB: 12.3 g/dL (ref 11.6–15.9)
LYMPH%: 45 % (ref 14.0–49.7)
MCH: 31.1 pg (ref 25.1–34.0)
MCHC: 33.5 g/dL (ref 31.5–36.0)
MCV: 92.7 fL (ref 79.5–101.0)
MONO#: 1.2 10*3/uL — ABNORMAL HIGH (ref 0.1–0.9)
MONO%: 15.9 % — AB (ref 0.0–14.0)
NEUT%: 32.7 % — ABNORMAL LOW (ref 38.4–76.8)
NEUTROS ABS: 2.4 10*3/uL (ref 1.5–6.5)
Platelets: 223 10*3/uL (ref 145–400)
RBC: 3.96 10*6/uL (ref 3.70–5.45)
RDW: 13.5 % (ref 11.2–14.5)
WBC: 7.2 10*3/uL (ref 3.9–10.3)
lymph#: 3.2 10*3/uL (ref 0.9–3.3)

## 2017-02-27 LAB — COMPREHENSIVE METABOLIC PANEL
ALT: 47 U/L (ref 0–55)
ANION GAP: 9 meq/L (ref 3–11)
AST: 37 U/L — AB (ref 5–34)
Albumin: 3.8 g/dL (ref 3.5–5.0)
Alkaline Phosphatase: 69 U/L (ref 40–150)
BILIRUBIN TOTAL: 0.35 mg/dL (ref 0.20–1.20)
BUN: 16.2 mg/dL (ref 7.0–26.0)
CALCIUM: 9.4 mg/dL (ref 8.4–10.4)
CHLORIDE: 105 meq/L (ref 98–109)
CO2: 26 mEq/L (ref 22–29)
Creatinine: 1.1 mg/dL (ref 0.6–1.1)
EGFR: 56 mL/min/{1.73_m2} — ABNORMAL LOW (ref >60)
Glucose: 88 mg/dl (ref 70–140)
Potassium: 3.5 mEq/L (ref 3.5–5.1)
Sodium: 141 mEq/L (ref 136–145)
TOTAL PROTEIN: 7.2 g/dL (ref 6.4–8.3)

## 2017-03-02 ENCOUNTER — Telehealth: Payer: Self-pay | Admitting: Hematology

## 2017-03-02 NOTE — Telephone Encounter (Signed)
Called patient re annual f/u. Per patient she does not wish to schedule right now and will call back when she is ready. Message routed to [place].

## 2017-03-30 ENCOUNTER — Other Ambulatory Visit: Payer: Self-pay | Admitting: Hematology

## 2017-03-30 DIAGNOSIS — Z853 Personal history of malignant neoplasm of breast: Secondary | ICD-10-CM

## 2017-05-08 ENCOUNTER — Ambulatory Visit
Admission: RE | Admit: 2017-05-08 | Discharge: 2017-05-08 | Disposition: A | Discharge status: Home / Self Care | Payer: Medicare Other | Source: Ambulatory Visit | Attending: Hematology | Admitting: Hematology

## 2017-05-08 DIAGNOSIS — Z853 Personal history of malignant neoplasm of breast: Secondary | ICD-10-CM

## 2018-03-01 ENCOUNTER — Encounter: Payer: Self-pay | Admitting: Hematology

## 2018-03-01 ENCOUNTER — Inpatient Hospital Stay: Payer: Medicare Other | Attending: Hematology | Admitting: Hematology

## 2018-03-01 VITALS — BP 158/87 | HR 90 | Temp 97.8°F | Resp 18 | Wt 151.7 lb

## 2018-03-01 DIAGNOSIS — Z853 Personal history of malignant neoplasm of breast: Secondary | ICD-10-CM

## 2018-03-01 DIAGNOSIS — Z170 Estrogen receptor positive status [ER+]: Secondary | ICD-10-CM

## 2018-03-01 DIAGNOSIS — Z1231 Encounter for screening mammogram for malignant neoplasm of breast: Secondary | ICD-10-CM

## 2018-03-01 DIAGNOSIS — C50411 Malignant neoplasm of upper-outer quadrant of right female breast: Secondary | ICD-10-CM

## 2018-03-01 NOTE — Progress Notes (Signed)
[place]   Telephone:(336) [phone] Fax:(336) [phone]   Clinic Follow up Note   Patient Care Team: Algis Greenhouse, MD as PCP - General (Family Medicine)  Date of Service:  03/01/2018  CHIEF COMPLAINT: F/u of right breast DCIS   SUMMARY OF ONCOLOGIC HISTORY:   DCIS Right Breast, ER/PR+, Low Grade   02/19/2012 Initial Biopsy    he underwent ultrasound-guided core needle biopsy on 02/19/2012. That demonstrated DCIS with calcifications, involving a papilloma. It is estimated the DCIS was 0.3 cm and was low grade with no necrosis. The DCIS was 100% ER and PR positive     03/08/2012 Breast MRI    She underwent MRI of her breasts on 03/08/2012. This demonstrated a 3.9 x 1.4 x 1.3 cm area of triangular shaped and linear low grade enhancement in the outer right breast, slightly superiorly, most consistent with post biopsy enhancement and hemorrhage    03/18/2012 Surgery    She underwent right breast lumpectomy on 03/18/2012. The specimen demonstrated atypical ductal hyperplasia but no residual malignancy     03/31/2012 Initial Diagnosis    DCIS Right Breast, ER/PR+, Low Grade, TisNx (stage0)    03/2012 - 03/2017 Anti-estrogen oral therapy    tamoxifen 20 mg since February 2014-2019      CURRENT THERAPY: Surveillance    INTERVAL HISTORY:  Alexandra Sloan is here for a follow up of right breast DCIS. She was last seen by me 1 year ago. She presents to the clinic today by herself. She notes she is doing well and feels the same after coming off Tamoxifen. She overall feels at baseline. She notes she f/u with her PCP but he does not do breast exams.  Her BP is elevated at 158/87 today, she denies headaches.     REVIEW OF SYSTEMS:   Constitutional: Denies fevers, chills or abnormal weight loss Eyes: Denies blurriness of vision Ears, nose, mouth, throat, and face: Denies mucositis or sore throat Respiratory: Denies cough, dyspnea or wheezes Cardiovascular: Denies  palpitation, chest discomfort or lower extremity swelling Gastrointestinal:  Denies nausea, heartburn or change in bowel habits Skin: Denies abnormal skin rashes (+) upper chest keloid  Lymphatics: Denies new lymphadenopathy or easy bruising Neurological:Denies numbness, tingling or new weaknesses Behavioral/Psych: Mood is stable, no new changes  All other systems were reviewed with the patient and are negative.  MEDICAL HISTORY:  Past Medical History:  Diagnosis Date  . Anosmia    Loss of smell  . Benign essential hypertension   . Breast cancer ([place]) 03/18/2012   Right Breast  . Esophageal reflux   . Pure hypercholesterolemia     SURGICAL HISTORY: Past Surgical History:  Procedure Laterality Date  . ABDOMINAL HYSTERECTOMY  03/1997  . APPENDECTOMY    . BREAST LUMPECTOMY Right 2014  . Breast, Needle Core Biopsy  02/13/2012   Ductal Carcinoma In-Situ/ Upper Outer Quadrant  . CATARACT EXTRACTION    . CHOLECYSTECTOMY    . COLONOSCOPY    . EXCISION / BIOPSY BREAST / NIPPLE / DUCT    . PARTIAL MASTECTOMY WITH NEEDLE LOCALIZATION  03/18/2012   Procedure: PARTIAL MASTECTOMY WITH NEEDLE LOCALIZATION;  Surgeon: Adin Hector, MD;  Location: [facility];  Service: General;  Laterality: Right;    I have reviewed the social history and family history with the patient and they are unchanged from previous note.  ALLERGIES:  is allergic to tape.  MEDICATIONS:  Current Outpatient Medications  Medication Sig Dispense Refill  .  Calcium Carbonate-Vitamin D (CALCIUM 600+D) 600-400 MG-UNIT tablet Take 1 tablet by mouth 2 (two) times daily.    . Cholecalciferol (VITAMIN D3) 5000 units TABS Take 5 tablets by mouth daily.     . valsartan-hydrochlorothiazide (DIOVAN-HCT) 80-12.5 MG per tablet      No current facility-administered medications for this visit.     PHYSICAL EXAMINATION: ECOG PERFORMANCE STATUS: 0 - Asymptomatic  Vitals:   03/01/18 1345  BP: (!) 158/87  Pulse:  90  Resp: 18  Temp: 97.8 F (36.6 C)  SpO2: 98%   Filed Weights   03/01/18 1345  Weight: 151 lb 11.2 oz (68.8 kg)    GENERAL:alert, no distress and comfortable SKIN: skin color, texture, turgor are normal, no rashes or significant lesions (+) Upper chest Keloid  EYES: normal, Conjunctiva are pink and non-injected, sclera clear OROPHARYNX:no exudate, no erythema and lips, buccal mucosa, and tongue normal  NECK: supple, thyroid normal size, non-tender, without nodularity LYMPH:  no palpable lymphadenopathy in the cervical, axillary or inguinal LUNGS: clear to auscultation and percussion with normal breathing effort HEART: regular rate & rhythm and no murmurs and no lower extremity edema ABDOMEN:abdomen soft, non-tender and normal bowel sounds Musculoskeletal:no cyanosis of digits and no clubbing  NEURO: alert & oriented x 3 with fluent speech, no focal motor/sensory deficits BREAST: S/p right breast lumpectomy: Surgical incision healed well with mild scar tissue. No palpable mass or adenopathy.   LABORATORY DATA:  I have reviewed the data as listed CBC Latest Ref Rng & Units 02/27/2017 03/07/2016 08/24/2015  WBC 3.9 - 10.3 10e3/uL 7.2 7.8 7.1  Hemoglobin 11.6 - 15.9 g/dL 12.3 12.1 13.2  Hematocrit 34.8 - 46.6 % 36.7 34.9 39.9  Platelets 145 - 400 10e3/uL 223 230 227     CMP Latest Ref Rng & Units 02/27/2017 03/07/2016 08/24/2015  Glucose 70 - 140 mg/dl 88 78 88  BUN 7.0 - 26.0 mg/dL 16.2 18.7 17.8  Creatinine 0.6 - 1.1 mg/dL 1.1 1.0 1.2(H)  Sodium 136 - 145 mEq/L 141 141 143  Potassium 3.5 - 5.1 mEq/L 3.5 3.4(L) 4.4  Chloride 98 - 107 mEq/L - - -  CO2 22 - 29 mEq/L 26 25 25   Calcium 8.4 - 10.4 mg/dL 9.4 9.9 10.6(H)  Total Protein 6.4 - 8.3 g/dL 7.2 7.3 7.8  Total Bilirubin 0.20 - 1.20 mg/dL 0.35 0.39 0.38  Alkaline Phos 40 - 150 U/L 69 67 67  AST 5 - 34 U/L 37(H) 21 29  ALT 0 - 55 U/L 47 22 36      RADIOGRAPHIC STUDIES: I have personally reviewed the radiological images as  listed and agreed with the findings in the report. No results found.   ASSESSMENT & PLAN:  Alexandra Sloan is a 84 y.o. female with   1. Right upper outer quadrant breast DCIS, ER/PR positive, low grade -She was diagnosed in 2014. She is s/p right breast lumpectomy.  -Her DCIS has been cured by surgical resection. She completed 5 years of Tamoxifen for cancer prevention in 03/2017.  -We reviewed her surveillance plan again, we'll continue annual mammogram, I encouraged her to do self exam, and see [place] routinely  -She is clinically doing well. Her physical exam and her 04/2017 mammogram were unremarkable. There is no clinical concern for recurrence. -Next mammogram in 04/2018 -F/u in 1 year    2. Bone health  -She had a prior bone density scan at her primary care physician's office -She is on calcium and vitamin D,  I encouraged her to continue  3. She'll continue follow-up with her primary care physician for other health related issues -BP elevated at 158/87 today (03/01/2018). I encouraged her to follow up with PCP.   Plan:  -F/u in one year  -mammogram in 04/2018   No problem-specific Assessment & Plan notes found for this encounter.   Orders Placed This Encounter  Procedures  . MM Digital Screening    Standing Status:   Future    Standing Expiration Date:   03/01/2019    Order Specific Question:   Reason for Exam (SYMPTOM  OR DIAGNOSIS REQUIRED)    Answer:   screening    Order Specific Question:   Preferred imaging location?    Answer:   [facility]   All questions were answered. The patient knows to call the clinic with any problems, questions or concerns. No barriers to learning was detected. I spent 15 minutes counseling the patient face to face. The total time spent in the appointment was 20 minutes and more than 50% was on counseling and review of test results     Truitt Merle, MD 03/01/2018   I, Joslyn Devon, am acting as scribe for Truitt Merle, MD.   I have reviewed  the above documentation for accuracy and completeness, and I agree with the above.

## 2018-03-02 ENCOUNTER — Telehealth: Payer: Self-pay | Admitting: Hematology

## 2018-03-02 NOTE — Telephone Encounter (Signed)
Scheduled appointments per 01/6 los.  Printed and mailed calendar.

## 2018-04-03 ENCOUNTER — Other Ambulatory Visit: Payer: Self-pay

## 2018-04-03 ENCOUNTER — Emergency Department (HOSPITAL_COMMUNITY)
Admission: EM | Admit: 2018-04-03 | Discharge: 2018-04-03 | Disposition: A | Discharge status: Home / Self Care | Payer: Medicare Other | Attending: Emergency Medicine | Admitting: Emergency Medicine

## 2018-04-03 DIAGNOSIS — I10 Essential (primary) hypertension: Secondary | ICD-10-CM | POA: Insufficient documentation

## 2018-04-03 DIAGNOSIS — Z79899 Other long term (current) drug therapy: Secondary | ICD-10-CM | POA: Insufficient documentation

## 2018-04-03 DIAGNOSIS — Z853 Personal history of malignant neoplasm of breast: Secondary | ICD-10-CM | POA: Diagnosis not present

## 2018-04-03 DIAGNOSIS — R040 Epistaxis: Secondary | ICD-10-CM | POA: Diagnosis present

## 2018-04-03 MED ORDER — SALINE SPRAY 0.65 % NA SOLN
1.0000 | NASAL | 0 refills | Status: AC | PRN
Start: 2018-04-03 — End: ?

## 2018-04-03 NOTE — ED Notes (Signed)
Additional complaints of congestion, cough, and itchy arms. Left ear lobe noted to be more swollen than the right. Pt states that her ear lobes get swollen when she takes her earrings out.

## 2018-04-03 NOTE — ED Triage Notes (Signed)
Patient c/o nosebleed when she blows her nose. No bleeding noted at this time.

## 2018-04-03 NOTE — ED Notes (Signed)
Patient verbalizes understanding of discharge instructions. Opportunity for questioning and answers were provided. Armband removed by staff, pt discharged from ED.  

## 2018-04-03 NOTE — ED Provider Notes (Signed)
[facility] EMERGENCY DEPARTMENT Provider Note   CSN: [id number] Arrival date & time: 04/03/18  2209     History   Chief Complaint Chief Complaint  Patient presents with  . Epistaxis    HPI Alexandra Sloan is a 84 y.o. female.  Patient presents to the emergency department with a chief complaint of intermittent epistaxis.  She states that she has some mild bleeding after she blows her nose.  She has noticed this for the past few days.  She denies any persistent bleeding, and states that she has been easily able to control her bleeding with pressure.  She denies any anticoagulant use.  Denies any other associated symptoms.  No bleeding in the emergency department.  The history is provided by the patient. No language interpreter was used.    Past Medical History:  Diagnosis Date  . Anosmia    Loss of smell  . Benign essential hypertension   . Breast cancer ([place]) 03/18/2012   Right Breast  . Esophageal reflux   . Pure hypercholesterolemia     Patient Active Problem List   Diagnosis Date Noted  . DCIS Right Breast, ER/PR+, Low Grade 03/31/2012    Past Surgical History:  Procedure Laterality Date  . ABDOMINAL HYSTERECTOMY  03/1997  . APPENDECTOMY    . BREAST LUMPECTOMY Right 2014  . Breast, Needle Core Biopsy  02/13/2012   Ductal Carcinoma In-Situ/ Upper Outer Quadrant  . CATARACT EXTRACTION    . CHOLECYSTECTOMY    . COLONOSCOPY    . EXCISION / BIOPSY BREAST / NIPPLE / DUCT    . PARTIAL MASTECTOMY WITH NEEDLE LOCALIZATION  03/18/2012   Procedure: PARTIAL MASTECTOMY WITH NEEDLE LOCALIZATION;  Surgeon: Adin Hector, MD;  Location: [place];  Service: General;  Laterality: Right;     OB History   No obstetric history on file.      Home Medications    Prior to Admission medications   Medication Sig Start Date End Date Taking? Authorizing Provider  Calcium Carbonate-Vitamin D (CALCIUM 600+D) 600-400 MG-UNIT tablet Take 1 tablet  by mouth 2 (two) times daily.    [provider]  Cholecalciferol (VITAMIN D3) 5000 units TABS Take 5 tablets by mouth daily.     [provider]  sodium chloride (OCEAN) 0.65 % SOLN nasal spray Place 1 spray into both nostrils as needed for congestion. 04/03/18   Montine Circle, PA-C  valsartan-hydrochlorothiazide (DIOVAN-HCT) 80-12.5 MG per tablet  03/30/13   [provider]    Family History Family History  Problem Relation Age of Onset  . Cancer Sister        breast  . Breast cancer Sister        diagnosed in her 17's    Social History Social History   Tobacco Use  . Smoking status: Never Smoker  . Smokeless tobacco: Never Used  Substance Use Topics  . Alcohol use: No  . Drug use: No     Allergies   Tape   Review of Systems Review of Systems  All other systems reviewed and are negative.    Physical Exam Updated Vital Signs BP (!) 153/79   Pulse 84   Temp 98.9 F (37.2 C) (Oral)   Resp 20   Ht 5' 2.5" (1.588 m)   Wt 68.5 kg   SpO2 95%   BMI 27.18 kg/m   Physical Exam Vitals signs and nursing note reviewed.  Constitutional:      Appearance:  She is well-developed.  HENT:     Head: Normocephalic and atraumatic.  Eyes:     Conjunctiva/sclera: Conjunctivae normal.     Pupils: Pupils are equal, round, and reactive to light.  Neck:     Musculoskeletal: Normal range of motion and neck supple.  Cardiovascular:     Rate and Rhythm: Normal rate and regular rhythm.     Heart sounds: No murmur. No friction rub. No gallop.   Pulmonary:     Effort: Pulmonary effort is normal. No respiratory distress.     Breath sounds: Normal breath sounds. No wheezing or rales.  Chest:     Chest wall: No tenderness.  Abdominal:     General: Bowel sounds are normal. There is no distension.     Palpations: Abdomen is soft. There is no mass.     Tenderness: There is no abdominal tenderness. There is no guarding or rebound.  Musculoskeletal: Normal  range of motion.        General: No tenderness.  Skin:    General: Skin is warm and dry.  Neurological:     Mental Status: She is alert and oriented to person, place, and time.  Psychiatric:        Behavior: Behavior normal.        Thought Content: Thought content normal.        Judgment: Judgment normal.      ED Treatments / Results  Labs (all labs ordered are listed, but only abnormal results are displayed) Labs Reviewed - No data to display  EKG None  Radiology No results found.  Procedures Procedures (including critical care time)  Medications Ordered in ED Medications - No data to display   Initial Impression / Assessment and Plan / ED Course  I have reviewed the triage vital signs and the nursing notes.  Pertinent labs & imaging results that were available during my care of the patient were reviewed by me and considered in my medical decision making (see chart for details).     Patient with intermittent nosebleeds.  Occurs when she blows her nose.  No persistent bleeding.  Well-appearing now, no bleeding now.  Vital signs are stable.  DC to home.   Will treat with nasal saline.  Final Clinical Impressions(s) / ED Diagnoses   Final diagnoses:  Epistaxis    ED Discharge Orders         Ordered    sodium chloride (OCEAN) 0.65 % SOLN nasal spray  As needed     04/03/18 2333           Montine Circle, PA-C 04/03/18 2336    Carmin Muskrat, MD 04/04/18 [phone]

## 2018-05-19 DIAGNOSIS — N183 Chronic kidney disease, stage 3 unspecified: Secondary | ICD-10-CM | POA: Insufficient documentation

## 2018-06-18 ENCOUNTER — Ambulatory Visit: Payer: Medicare Other

## 2018-08-12 ENCOUNTER — Ambulatory Visit: Payer: Medicare Other

## 2018-08-13 ENCOUNTER — Ambulatory Visit
Admission: RE | Admit: 2018-08-13 | Discharge: 2018-08-13 | Disposition: A | Discharge status: Home / Self Care | Payer: Medicare Other | Source: Ambulatory Visit | Attending: Hematology | Admitting: Hematology

## 2018-08-13 ENCOUNTER — Other Ambulatory Visit: Payer: Self-pay

## 2018-08-13 DIAGNOSIS — Z1231 Encounter for screening mammogram for malignant neoplasm of breast: Secondary | ICD-10-CM

## 2019-03-03 ENCOUNTER — Ambulatory Visit: Payer: Medicare Other | Admitting: Hematology

## 2019-03-03 ENCOUNTER — Telehealth: Payer: Self-pay | Admitting: Hematology

## 2019-03-03 NOTE — Telephone Encounter (Signed)
R/s appt per 1/7 sch message - pt aware of new appt date and time in 6 months

## 2019-07-11 ENCOUNTER — Other Ambulatory Visit: Payer: Self-pay | Admitting: Hematology

## 2019-07-11 DIAGNOSIS — Z1231 Encounter for screening mammogram for malignant neoplasm of breast: Secondary | ICD-10-CM

## 2019-08-15 ENCOUNTER — Other Ambulatory Visit: Payer: Self-pay

## 2019-08-15 ENCOUNTER — Ambulatory Visit
Admission: RE | Admit: 2019-08-15 | Discharge: 2019-08-15 | Disposition: A | Discharge status: Home / Self Care | Payer: Medicare Other | Source: Ambulatory Visit | Attending: Hematology | Admitting: Hematology

## 2019-08-15 DIAGNOSIS — Z1231 Encounter for screening mammogram for malignant neoplasm of breast: Secondary | ICD-10-CM

## 2019-08-24 NOTE — Progress Notes (Signed)
Alexandra Sloan   Telephone:(336) [phone] Fax:(336) [phone]   Clinic Follow up Note   Patient Care Team: Algis Greenhouse, MD as PCP - General (Family Medicine)  Date of Service:  08/31/2019  CHIEF COMPLAINT: F/u of right breast DCIS   SUMMARY OF ONCOLOGIC HISTORY: Oncology History  DCIS Right Breast, ER/PR+, Low Grade  02/19/2012 Initial Biopsy   he underwent ultrasound-guided core needle biopsy on 02/19/2012. That demonstrated DCIS with calcifications, involving a papilloma. It is estimated the DCIS was 0.3 cm and was low grade with no necrosis. The DCIS was 100% ER and PR positive    03/08/2012 Breast MRI   She underwent MRI of her breasts on 03/08/2012. This demonstrated a 3.9 x 1.4 x 1.3 cm area of triangular shaped and linear low grade enhancement in the outer right breast, slightly superiorly, most consistent with post biopsy enhancement and hemorrhage   03/18/2012 Surgery   She underwent right breast lumpectomy on 03/18/2012. The specimen demonstrated atypical ductal hyperplasia but no residual malignancy    03/31/2012 Initial Diagnosis   DCIS Right Breast, ER/PR+, Low Grade, TisNx (stage0)   03/2012 - 03/2017 Anti-estrogen oral therapy   tamoxifen 20 mg since February 2014-2019       CURRENT THERAPY:  Surveillance   INTERVAL HISTORY:  Alexandra Sloan is here for a follow up of right breast DCIS. She was last seen by me in 02/2018. She presents to the clinic alone. She notes she is doing well. She has no new major changes in the past year. I reviewed her medication list with her. She notes her HTN does fluctuate and recently had dose increase of one of her medications. She denies, breathing or chest issues. She notes she continues to take care of herself well without help.   REVIEW OF SYSTEMS:   Constitutional: Denies fevers, chills or abnormal weight loss Eyes: Denies blurriness of vision Ears, nose, mouth, throat, and face: Denies mucositis or sore  throat Respiratory: Denies cough, dyspnea or wheezes Cardiovascular: Denies palpitation, chest discomfort or lower extremity swelling Gastrointestinal:  Denies nausea, heartburn or change in bowel habits Skin: Denies abnormal skin rashes Lymphatics: Denies new lymphadenopathy or easy bruising Neurological:Denies numbness, tingling or new weaknesses Behavioral/Psych: Mood is stable, no new changes  All other systems were reviewed with the patient and are negative.  MEDICAL HISTORY:  Past Medical History:  Diagnosis Date  . Anosmia    Loss of smell  . Benign essential hypertension   . Breast cancer ([place]) 03/18/2012   Right Breast  . Esophageal reflux   . Pure hypercholesterolemia     SURGICAL HISTORY: Past Surgical History:  Procedure Laterality Date  . ABDOMINAL HYSTERECTOMY  03/1997  . APPENDECTOMY    . BREAST LUMPECTOMY Right 2014  . Breast, Needle Core Biopsy  02/13/2012   Ductal Carcinoma In-Situ/ Upper Outer Quadrant  . CATARACT EXTRACTION    . CHOLECYSTECTOMY    . COLONOSCOPY    . EXCISION / BIOPSY BREAST / NIPPLE / DUCT    . PARTIAL MASTECTOMY WITH NEEDLE LOCALIZATION  03/18/2012   Procedure: PARTIAL MASTECTOMY WITH NEEDLE LOCALIZATION;  Surgeon: Adin Hector, MD;  Location: [place];  Service: General;  Laterality: Right;    I have reviewed the social history and family history with the patient and they are unchanged from previous note.  ALLERGIES:  is allergic to tape.  MEDICATIONS:  Current Outpatient Medications  Medication Sig Dispense Refill  . Calcium Carbonate-Vitamin D (  CALCIUM 600+D) 600-400 MG-UNIT tablet Take 1 tablet by mouth 2 (two) times daily.    . sodium chloride (OCEAN) 0.65 % SOLN nasal spray Place 1 spray into both nostrils as needed for congestion. 1 Bottle 0  . valsartan-hydrochlorothiazide (DIOVAN-HCT) 160-12.5 MG tablet Take 1 tablet by mouth daily.     No current facility-administered medications for this visit.     PHYSICAL EXAMINATION: ECOG PERFORMANCE STATUS: 0 - Asymptomatic  Vitals:   08/31/19 1026  BP: (!) 153/75  Pulse: 85  Resp: 18  Temp: 98.6 F (37 C)  SpO2: 99%   Filed Weights   08/31/19 1026  Weight: 154 lb 14.4 oz (70.3 kg)    GENERAL:alert, no distress and comfortable SKIN: skin color, texture, turgor are normal, no rashes or significant lesions EYES: normal, Conjunctiva are pink and non-injected, sclera clear  NECK: supple, thyroid normal size, non-tender, without nodularity LYMPH:  no palpable lymphadenopathy in the cervical, axillary  LUNGS: clear to auscultation and percussion with normal breathing effort HEART: regular rate & rhythm and no murmurs and no lower extremity edema ABDOMEN:abdomen soft, non-tender and normal bowel sounds Musculoskeletal:no cyanosis of digits and no clubbing  NEURO: alert & oriented x 3 with fluent speech, no focal motor/sensory deficits BREAST: s/p right lumpectomy: Surgical incision healed well with scar tissue. No palpable mass, nodules or adenopathy bilaterally. Breast exam benign.   LABORATORY DATA:  I have reviewed the data as listed CBC Latest Ref Rng & Units 02/27/2017 03/07/2016 08/24/2015  WBC 3.9 - 10.3 10e3/uL 7.2 7.8 7.1  Hemoglobin 11.6 - 15.9 g/dL 12.3 12.1 13.2  Hematocrit 34 - 46 % 36.7 34.9 39.9  Platelets 145 - 400 10e3/uL 223 230 227     CMP Latest Ref Rng & Units 02/27/2017 03/07/2016 08/24/2015  Glucose 70 - 140 mg/dl 88 78 88  BUN 7.0 - 26.0 mg/dL 16.2 18.7 17.8  Creatinine 0.6 - 1.1 mg/dL 1.1 1.0 1.2(H)  Sodium 136 - 145 mEq/L 141 141 143  Potassium 3.5 - 5.1 mEq/L 3.5 3.4(L) 4.4  Chloride 98 - 107 mEq/L - - -  CO2 22 - 29 mEq/L 26 25 25   Calcium 8.4 - 10.4 mg/dL 9.4 9.9 10.6(H)  Total Protein 6.4 - 8.3 g/dL 7.2 7.3 7.8  Total Bilirubin 0.20 - 1.20 mg/dL 0.35 0.39 0.38  Alkaline Phos 40 - 150 U/L 69 67 67  AST 5 - 34 U/L 37(H) 21 29  ALT 0 - 55 U/L 47 22 36      RADIOGRAPHIC STUDIES: I have personally  reviewed the radiological images as listed and agreed with the findings in the report. No results found.   ASSESSMENT & PLAN:  Alexandra Sloan is a 85 y.o. female with    1. Right upper outer quadrant breast DCIS, ER/PR positive, low grade -She was diagnosed in 2014. She is s/p right breast lumpectomy.  -Her DCIS has been cured by surgical resection. She completed 5 years of Tamoxifen for cancer prevention in 03/2017.  -She is clinically doing well. Her physical exam and her 07/2019 mammogram were unremarkable. There is no clinical concern for recurrence. -She was diagnosed 7 years ago. She is fine to continue cancer surveillance with her PCP. She opted to continue surveillance with our clinic every 2 years. She will do labs and bone scans with her PCP and will continue yearly mammograms.  -I will order Next mammogram 07/2020  -F/u in 2 years with NP Lac[facility]    2. She'll continue follow-up  with her primary care physician for other health related issues  -She notes her PCP recently increased her HTN medication. Her BP is elevated during office visits, but otherwise better. BP 153/75 today (08/31/19).   Plan:  -Mammogram in 07/2020 -F/u with NP Lacie in 2 years.    No problem-specific Assessment & Plan notes found for this encounter.   Orders Placed This Encounter  Procedures  . MM Digital Screening    Standing Status:   Future    Standing Expiration Date:   08/30/2020    Order Specific Question:   Reason for Exam (SYMPTOM  OR DIAGNOSIS REQUIRED)    Answer:   screening    Order Specific Question:   Preferred imaging location?    Answer:   [facility]   All questions were answered. The patient knows to call the clinic with any problems, questions or concerns. No barriers to learning was detected. The total time spent in the appointment was 25 minutes.     Truitt Merle, MD 08/31/2019   I, Joslyn Devon, am acting as scribe for Truitt Merle, MD.   I have reviewed the above documentation  for accuracy and completeness, and I agree with the above.

## 2019-08-31 ENCOUNTER — Inpatient Hospital Stay: Payer: Medicare Other | Attending: Hematology | Admitting: Hematology

## 2019-08-31 ENCOUNTER — Encounter: Payer: Self-pay | Admitting: Hematology

## 2019-08-31 ENCOUNTER — Other Ambulatory Visit: Payer: Self-pay

## 2019-08-31 VITALS — BP 153/75 | HR 85 | Temp 98.6°F | Resp 18 | Ht 62.5 in | Wt 154.9 lb

## 2019-08-31 DIAGNOSIS — I10 Essential (primary) hypertension: Secondary | ICD-10-CM | POA: Diagnosis not present

## 2019-08-31 DIAGNOSIS — Z170 Estrogen receptor positive status [ER+]: Secondary | ICD-10-CM

## 2019-08-31 DIAGNOSIS — Z1231 Encounter for screening mammogram for malignant neoplasm of breast: Secondary | ICD-10-CM

## 2019-08-31 DIAGNOSIS — Z79899 Other long term (current) drug therapy: Secondary | ICD-10-CM | POA: Diagnosis not present

## 2019-08-31 DIAGNOSIS — Z86000 Personal history of in-situ neoplasm of breast: Secondary | ICD-10-CM | POA: Insufficient documentation

## 2019-08-31 DIAGNOSIS — Z90710 Acquired absence of both cervix and uterus: Secondary | ICD-10-CM | POA: Diagnosis not present

## 2019-08-31 DIAGNOSIS — E7800 Pure hypercholesterolemia, unspecified: Secondary | ICD-10-CM | POA: Insufficient documentation

## 2019-08-31 DIAGNOSIS — C50411 Malignant neoplasm of upper-outer quadrant of right female breast: Secondary | ICD-10-CM | POA: Diagnosis not present

## 2019-09-01 ENCOUNTER — Telehealth: Payer: Self-pay | Admitting: Hematology

## 2019-09-01 NOTE — Telephone Encounter (Signed)
Unable to schedule pt in 2 years. Template is only available to schedule till the end of 2022.

## 2020-05-15 ENCOUNTER — Telehealth: Payer: Self-pay | Admitting: Hematology

## 2020-05-15 NOTE — Telephone Encounter (Signed)
Scheduled per 7/7 los. Mailing out appt letter and calendar printout

## 2020-07-09 ENCOUNTER — Other Ambulatory Visit: Payer: Self-pay | Admitting: Hematology

## 2020-07-09 DIAGNOSIS — Z1231 Encounter for screening mammogram for malignant neoplasm of breast: Secondary | ICD-10-CM

## 2020-08-22 ENCOUNTER — Ambulatory Visit: Payer: Medicare Other | Admitting: Nurse Practitioner

## 2020-09-04 ENCOUNTER — Ambulatory Visit
Admission: RE | Admit: 2020-09-04 | Discharge: 2020-09-04 | Disposition: A | Discharge status: Home / Self Care | Payer: Medicare Other | Source: Ambulatory Visit | Attending: Hematology | Admitting: Hematology

## 2020-09-04 ENCOUNTER — Other Ambulatory Visit: Payer: Self-pay

## 2020-09-04 DIAGNOSIS — Z1231 Encounter for screening mammogram for malignant neoplasm of breast: Secondary | ICD-10-CM

## 2021-04-22 ENCOUNTER — Telehealth: Payer: Self-pay | Admitting: Hematology

## 2021-04-22 NOTE — Telephone Encounter (Signed)
Scheduled follow-up appointment per 2/23 staff message. Patient is aware.

## 2021-04-26 ENCOUNTER — Encounter: Payer: Self-pay | Admitting: Adult Health

## 2021-04-26 ENCOUNTER — Other Ambulatory Visit: Payer: Self-pay

## 2021-04-26 ENCOUNTER — Inpatient Hospital Stay: Payer: Medicare Other | Attending: Adult Health | Admitting: Adult Health

## 2021-04-26 VITALS — BP 185/83 | HR 96 | Temp 97.9°F | Resp 16 | Ht 62.0 in | Wt 153.0 lb

## 2021-04-26 DIAGNOSIS — Z86000 Personal history of in-situ neoplasm of breast: Secondary | ICD-10-CM | POA: Diagnosis present

## 2021-04-26 DIAGNOSIS — C50411 Malignant neoplasm of upper-outer quadrant of right female breast: Secondary | ICD-10-CM | POA: Diagnosis not present

## 2021-04-26 DIAGNOSIS — Z170 Estrogen receptor positive status [ER+]: Secondary | ICD-10-CM

## 2021-04-26 NOTE — Progress Notes (Addendum)
[facility] Follow up:    Alexandra Greenhouse, MD [place] [id number]   DIAGNOSIS: DCIS  SUMMARY OF ONCOLOGIC HISTORY: Oncology History  DCIS Right Breast, ER/PR+, Low Grade  02/19/2012 Initial Biopsy   he underwent ultrasound-guided core needle biopsy on 02/19/2012. That demonstrated DCIS with calcifications, involving a papilloma. It is estimated the DCIS was 0.3 cm and was low grade with no necrosis. The DCIS was 100% ER and PR positive    03/08/2012 Breast MRI   She underwent MRI of her breasts on 03/08/2012. This demonstrated a 3.9 x 1.4 x 1.3 cm area of triangular shaped and linear low grade enhancement in the outer right breast, slightly superiorly, most consistent with post biopsy enhancement and hemorrhage   03/18/2012 Surgery   She underwent right breast lumpectomy on 03/18/2012. The specimen demonstrated atypical ductal hyperplasia but no residual malignancy    03/31/2012 Initial Diagnosis   DCIS Right Breast, ER/PR+, Low Grade, TisNx (stage0)   03/2012 - 03/2017 Anti-estrogen oral therapy   tamoxifen 20 mg since February 2014-2019      CURRENT THERAPY: observation  INTERVAL HISTORY: Alexandra Sloan 87 y.o. female returns for evaluation of her history of DCIS her most recent mammogram was completed on September 04, 2020.  This showed no evidence of malignancy and breast density category A.  She has had no health changes in the past year.  She enjoys word and number search puzzles.  During the day she does housework and enjoys walking.     Patient Active Problem List   Diagnosis Date Noted   Chronic kidney disease, stage 3 ([place]) 05/19/2018   History of breast cancer 04/05/2015   Vitamin D deficiency 04/05/2015   Essential hypertension 10/04/2012   DCIS Right Breast, ER/PR+, Low Grade 03/31/2012    is allergic to tape.  MEDICAL HISTORY: Past Medical History:  Diagnosis Date   Anosmia    Loss of smell   Benign essential hypertension     Breast cancer ([place]) 03/18/2012   Right Breast   Esophageal reflux    Pure hypercholesterolemia     SURGICAL HISTORY: Past Surgical History:  Procedure Laterality Date   ABDOMINAL HYSTERECTOMY  03/1997   APPENDECTOMY     BREAST LUMPECTOMY Right 2014   Breast, Needle Core Biopsy  02/13/2012   Ductal Carcinoma In-Situ/ Upper Outer Quadrant   CATARACT EXTRACTION     CHOLECYSTECTOMY     COLONOSCOPY     EXCISION / BIOPSY BREAST / NIPPLE / DUCT     PARTIAL MASTECTOMY WITH NEEDLE LOCALIZATION  03/18/2012   Procedure: PARTIAL MASTECTOMY WITH NEEDLE LOCALIZATION;  Surgeon: Adin Hector, MD;  Location: [facility];  Service: General;  Laterality: Right;    SOCIAL HISTORY: Social History   Socioeconomic History   Marital status: Divorced    Spouse name: Not on file   Number of children: Not on file   Years of education: Not on file   Highest education level: Not on file  Occupational History   Not on file  Tobacco Use   Smoking status: Never   Smokeless tobacco: Never  Substance and Sexual Activity   Alcohol use: No   Drug use: No   Sexual activity: Not Currently  Other Topics Concern   Not on file  Social History Narrative   Not on file   Social Determinants of Health   Financial Resource Strain: Not on file  Food Insecurity: Not on file  Transportation Needs: Not on file  Physical Activity: Not on file  Stress: Not on file  Social Connections: Not on file  Intimate Partner Violence: Not on file    FAMILY HISTORY: Family History  Problem Relation Age of Onset   Cancer Sister        breast   Breast cancer Sister        diagnosed in her 81's    Review of Systems  Constitutional:  Negative for appetite change, chills, fatigue, fever and unexpected weight change.  HENT:   Negative for hearing loss, lump/mass and trouble swallowing.   Eyes:  Negative for eye problems and icterus.  Respiratory:  Negative for chest tightness, cough and shortness  of breath.   Cardiovascular:  Negative for chest pain, leg swelling and palpitations.  Gastrointestinal:  Negative for abdominal distention, abdominal pain, constipation, diarrhea, nausea and vomiting.  Endocrine: Negative for hot flashes.  Genitourinary:  Negative for difficulty urinating.   Musculoskeletal:  Negative for arthralgias.  Skin:  Negative for itching and rash.  Neurological:  Negative for dizziness, extremity weakness, headaches and numbness.  Hematological:  Negative for adenopathy. Does not bruise/bleed easily.  Psychiatric/Behavioral:  Negative for depression. The patient is not nervous/anxious.      PHYSICAL EXAMINATION  ECOG PERFORMANCE STATUS: 0 - Asymptomatic  Vitals:   04/26/21 1342  BP: (!) 185/83  Pulse: 96  Resp: 16  Temp: 97.9 F (36.6 C)  SpO2: 97%    Physical Exam Constitutional:      General: She is not in acute distress.    Appearance: Normal appearance. She is not toxic-appearing.  HENT:     Head: Normocephalic and atraumatic.  Eyes:     General: No scleral icterus. Cardiovascular:     Rate and Rhythm: Normal rate and regular rhythm.     Pulses: Normal pulses.     Heart sounds: Normal heart sounds.  Pulmonary:     Effort: Pulmonary effort is normal.     Breath sounds: Normal breath sounds.  Chest:     Comments: Right breast s/p lumpectomy, no sign of local recurrence, left breast benign Abdominal:     General: Abdomen is flat. Bowel sounds are normal. There is no distension.     Palpations: Abdomen is soft.     Tenderness: There is no abdominal tenderness.  Musculoskeletal:        General: No swelling.     Cervical back: Neck supple.  Lymphadenopathy:     Cervical: No cervical adenopathy.  Skin:    General: Skin is warm and dry.     Findings: No rash.  Neurological:     General: No focal deficit present.     Mental Status: She is alert.  Psychiatric:        Mood and Affect: Mood normal.        Behavior: Behavior normal.     LABORATORY DATA:  CBC    Component Value Date/Time   WBC 7.2 02/27/2017 1236   RBC 3.96 02/27/2017 1236   HGB 12.3 02/27/2017 1236   HCT 36.7 02/27/2017 1236   PLT 223 02/27/2017 1236   MCV 92.7 02/27/2017 1236   MCH 31.1 02/27/2017 1236   MCHC 33.5 02/27/2017 1236   RDW 13.5 02/27/2017 1236   LYMPHSABS 3.2 02/27/2017 1236   MONOABS 1.2 (H) 02/27/2017 1236   EOSABS 0.4 02/27/2017 1236   BASOSABS 0.1 02/27/2017 1236    CMP     Component Value Date/Time   NA  141 02/27/2017 1236   K 3.5 02/27/2017 1236   CL 102 04/06/2012 1025   CO2 26 02/27/2017 1236   GLUCOSE 88 02/27/2017 1236   GLUCOSE 90 04/06/2012 1025   BUN 16.2 02/27/2017 1236   CREATININE 1.1 02/27/2017 1236   CALCIUM 9.4 02/27/2017 1236   PROT 7.2 02/27/2017 1236   ALBUMIN 3.8 02/27/2017 1236   AST 37 (H) 02/27/2017 1236   ALT 47 02/27/2017 1236   ALKPHOS 69 02/27/2017 1236   BILITOT 0.35 02/27/2017 1236   GFRNONAA 53 (L) 03/15/2012 1530   GFRAA 62 (L) 03/15/2012 1530    ASSESSMENT and THERAPY PLAN:   DCIS Right Breast, ER/PR+, Low Grade Alexandra Sloan is an 87 y/o woman with DCIS diag s/p lumpectomy and antiestrogen therapy with tamoxifen 20mg  x 5 years completing therapy in 2019.  1. DCIS: no clinical or radiographic sign of breast cancer recurrence.  I recommended that she continue with annual mammograms and that she undergo an annual clinical breast exam.  2.  We discussed healthy diet and exercise.  She has remained active.  The word searches and numbers searches are great brain exercise for her.  3.  She was recommended to continue follow-up with her primary care provider.  Other cancer screening she should undergo annually include skin cancer screenings.  We will see Alexandra Sloan back in 2 years for follow-up of her noninvasive breast cancer.  At that time she will likely graduate from our care.   All questions were answered. The patient knows to call the clinic with any problems, questions or concerns. We  can certainly see the patient much sooner if necessary.    Total encounter time: 20 minutes in face-to-face visit time, chart review, lab review, care coordination, order entry, and documentation of the encounter.  Wilber Bihari, NP 04/27/21 8:56 AM Medical Oncology and Hematology [facility] [place], [place] [address] Tel. [phone]    Fax. [phone]  *Total Encounter Time as defined by the Centers for Medicare and Medicaid Services includes, in addition to the face-to-face time of a patient visit (documented in the note above) non-face-to-face time: obtaining and reviewing outside history, ordering and reviewing medications, tests or procedures, care coordination (communications with other health care professionals or caregivers) and documentation in the medical record.

## 2021-04-27 NOTE — Assessment & Plan Note (Signed)
Alexandra Sloan is an 87 y/o woman with DCIS diag s/p lumpectomy and antiestrogen therapy with tamoxifen '20mg'$  x 5 years completing therapy in 2019. ? ?1. DCIS: no clinical or radiographic sign of breast cancer recurrence.  I recommended that she continue with annual mammograms and that she undergo an annual clinical breast exam. ? ?2.  We discussed healthy diet and exercise.  She has remained active.  The word searches and numbers searches are great brain exercise for her. ? ?3.  She was recommended to continue follow-up with her primary care provider.  Other cancer screening she should undergo annually include skin cancer screenings. ? ?We will see Alexandra Sloan back in 2 years for follow-up of her noninvasive breast cancer.  At that time she will likely graduate from our care. ? ? ?

## 2021-06-07 ENCOUNTER — Inpatient Hospital Stay: Payer: Medicare Other

## 2021-06-28 ENCOUNTER — Ambulatory Visit: Payer: Medicare Other | Admitting: Adult Health

## 2021-06-28 ENCOUNTER — Other Ambulatory Visit: Payer: Medicare Other

## 2021-06-28 ENCOUNTER — Ambulatory Visit: Payer: Medicare Other

## 2021-08-05 ENCOUNTER — Other Ambulatory Visit: Payer: Self-pay | Admitting: Hematology

## 2021-08-05 DIAGNOSIS — Z1231 Encounter for screening mammogram for malignant neoplasm of breast: Secondary | ICD-10-CM

## 2021-08-29 ENCOUNTER — Ambulatory Visit: Payer: Medicare Other | Admitting: Nurse Practitioner

## 2021-09-06 ENCOUNTER — Ambulatory Visit
Admission: RE | Admit: 2021-09-06 | Discharge: 2021-09-06 | Disposition: A | Discharge status: Home / Self Care | Payer: Medicare Other | Source: Ambulatory Visit | Attending: Hematology | Admitting: Hematology

## 2021-09-06 DIAGNOSIS — Z1231 Encounter for screening mammogram for malignant neoplasm of breast: Secondary | ICD-10-CM

## 2021-10-31 IMAGING — MG MM DIGITAL SCREENING BILAT W/ TOMO AND CAD
6 of 10 series · 6 of 30 positions shown · non-contrast
Comparison: Previous exam(s).

ACR Breast Density Category a: The breast tissue is almost entirely
fatty.

CLINICAL DATA: Screening.

EXAM:
DIGITAL SCREENING BILATERAL MAMMOGRAM WITH TOMOSYNTHESIS AND CAD
TECHNIQUE: Bilateral screening digital craniocaudal and mediolateral oblique
mammograms were obtained. Bilateral screening digital breast
tomosynthesis was performed. The images were evaluated with
computer-aided detection.

[R CC synth-2D]
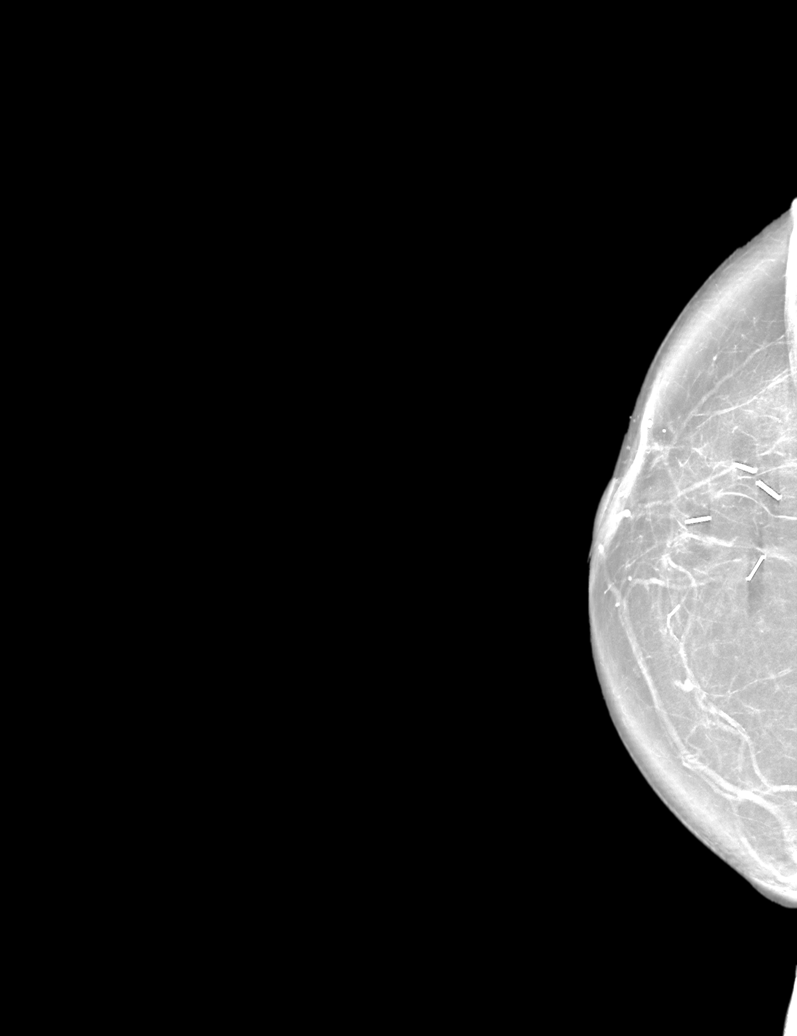

[L MLO synth-2D (1 of 2)]
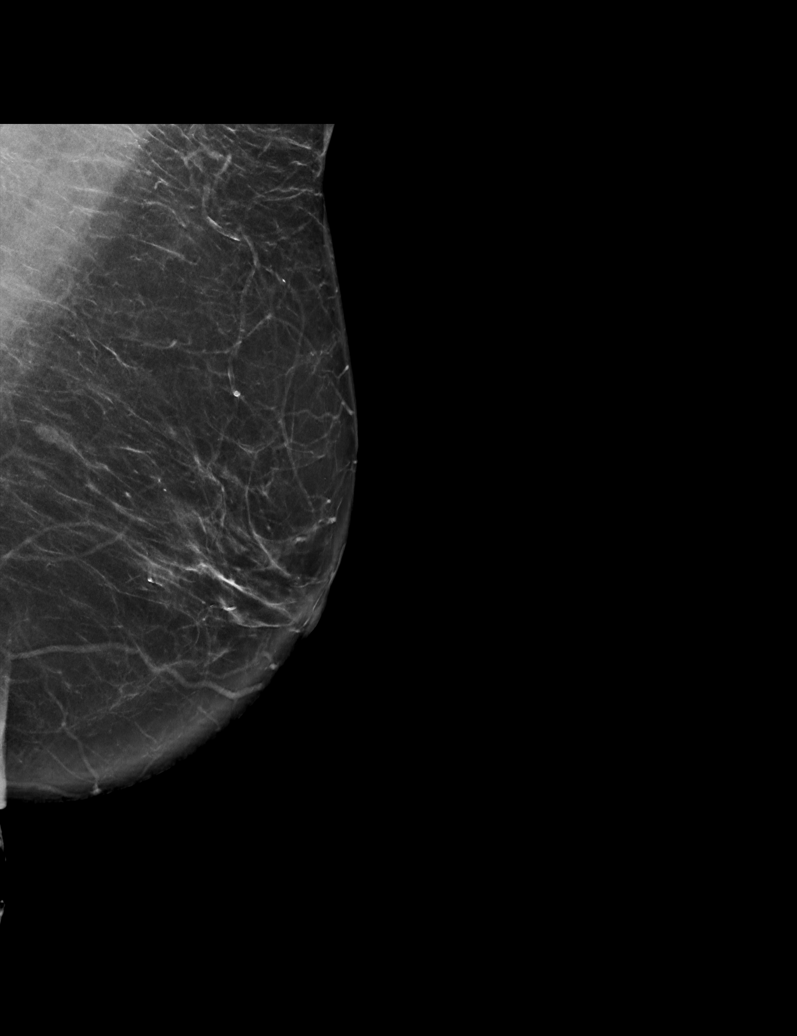

[L MLO synth-2D (2 of 2)]
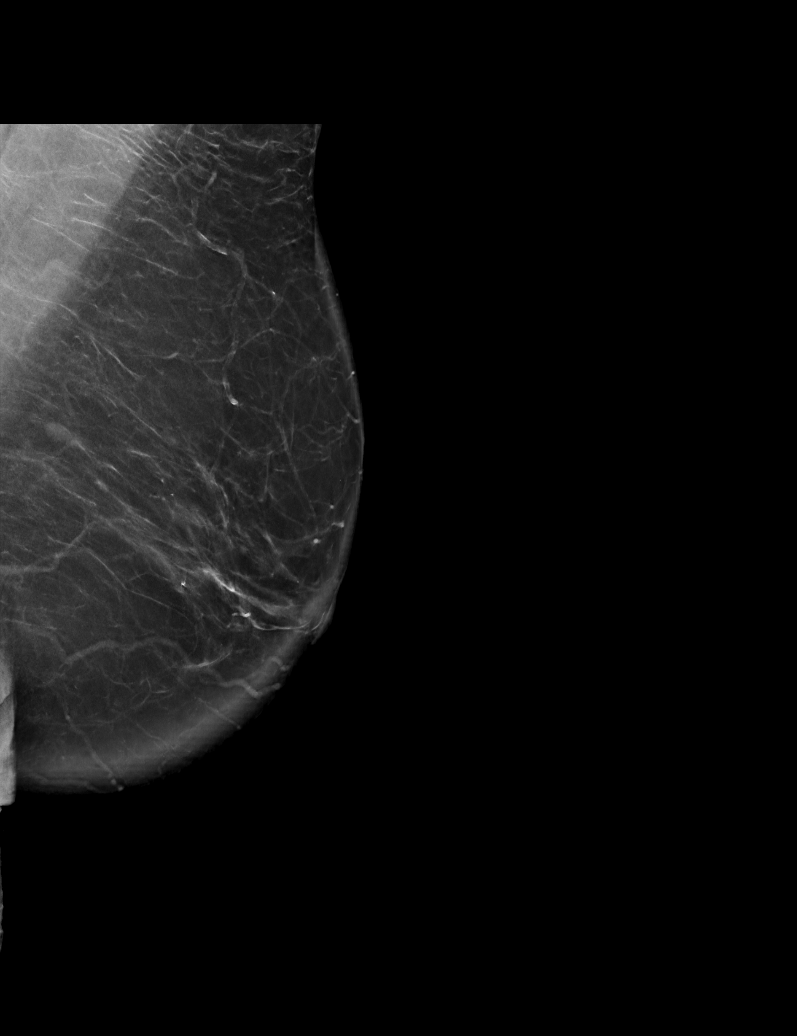

[L CC synth-2D]
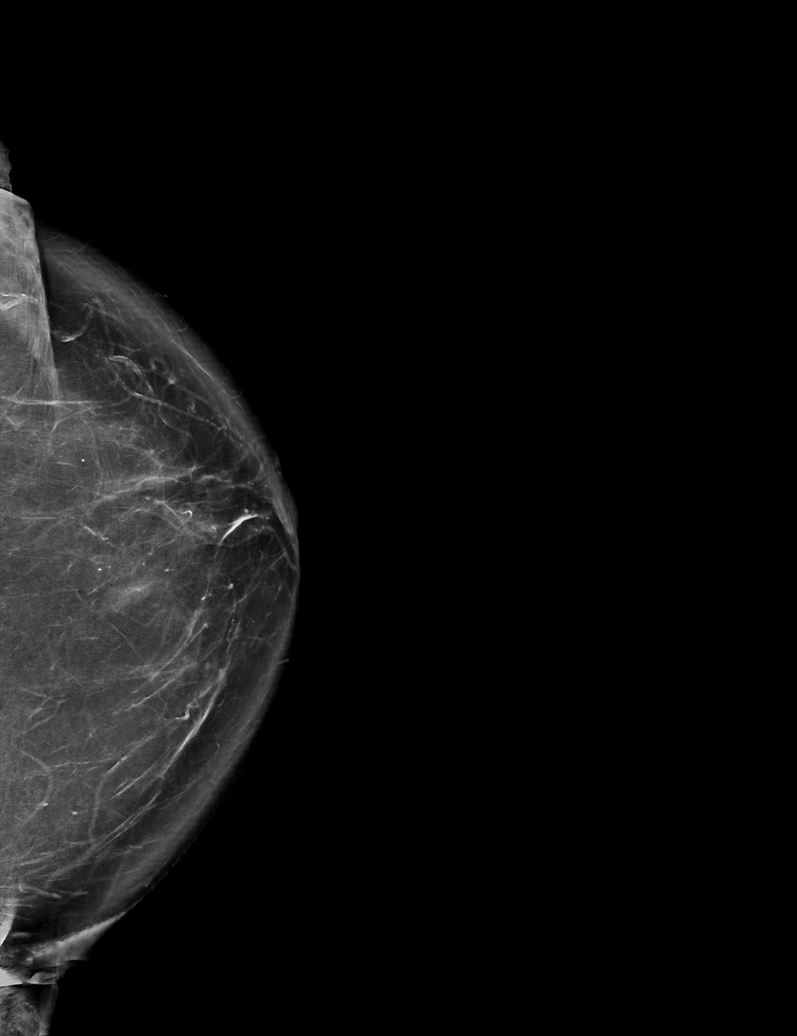

[R MLO synth-2D]
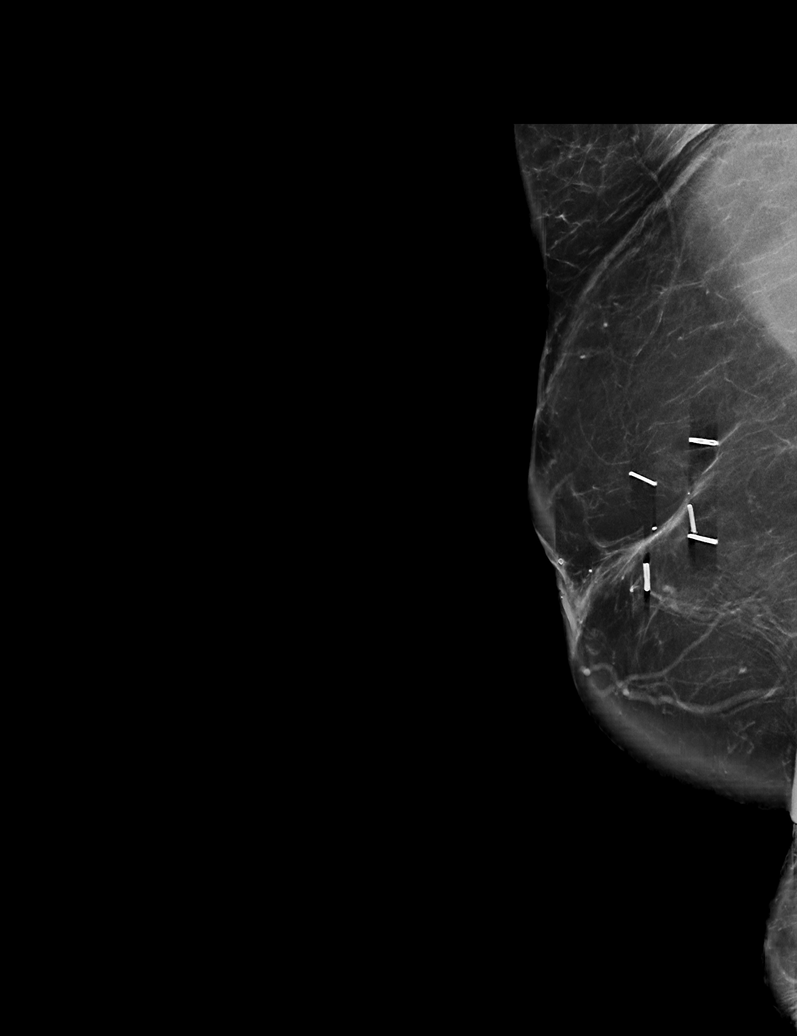

[R MLO tomo · tomo slice 42/83.0]
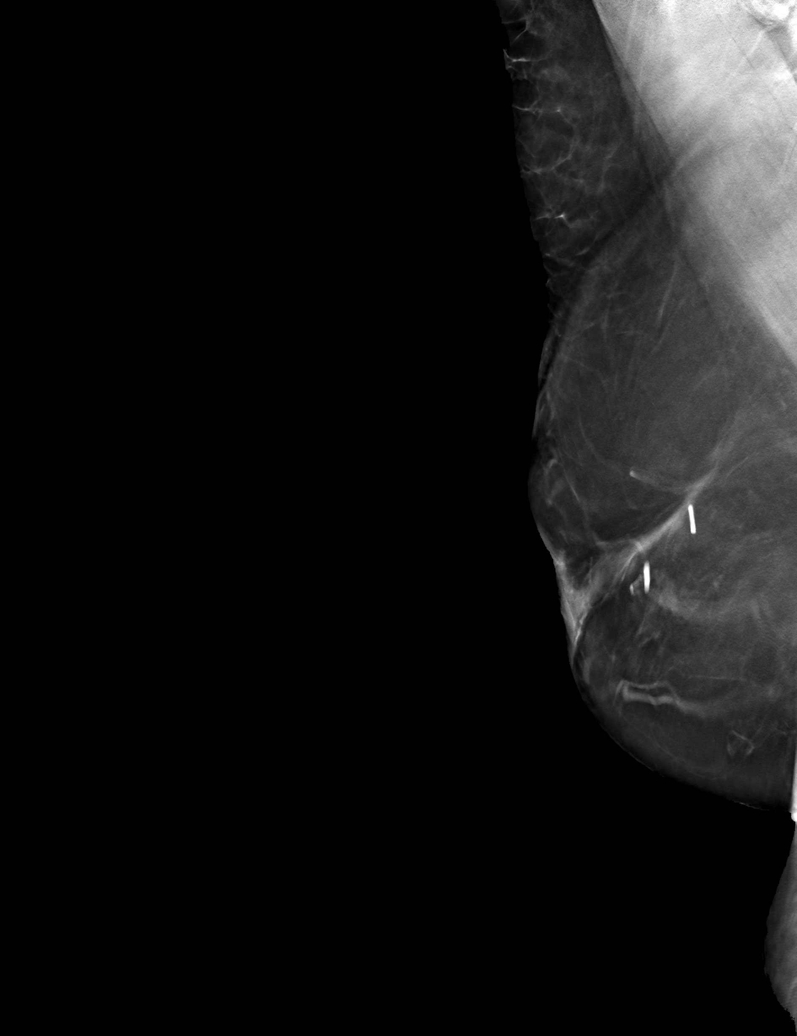

[6 of 30 positions shown; findings below may reference images not displayed]

FINDINGS: There are no findings suspicious for malignancy.
IMPRESSION: No mammographic evidence of malignancy. A result letter of this
screening mammogram will be mailed directly to the patient.

RECOMMENDATION:
Screening mammogram in one year. (Code:0E-3-N98)

BI-RADS CATEGORY  1: Negative.

## 2022-08-11 ENCOUNTER — Other Ambulatory Visit: Payer: Self-pay

## 2022-08-11 DIAGNOSIS — Z1231 Encounter for screening mammogram for malignant neoplasm of breast: Secondary | ICD-10-CM

## 2022-09-10 ENCOUNTER — Ambulatory Visit: Payer: Medicare Other

## 2022-09-10 DIAGNOSIS — Z1231 Encounter for screening mammogram for malignant neoplasm of breast: Secondary | ICD-10-CM

## 2022-09-12 ENCOUNTER — Other Ambulatory Visit: Payer: Self-pay | Admitting: Family Medicine

## 2022-09-12 DIAGNOSIS — R928 Other abnormal and inconclusive findings on diagnostic imaging of breast: Secondary | ICD-10-CM

## 2022-09-19 ENCOUNTER — Ambulatory Visit
Admission: RE | Admit: 2022-09-19 | Discharge: 2022-09-19 | Disposition: A | Discharge status: Home / Self Care | Payer: Medicare Other | Source: Ambulatory Visit | Attending: Family Medicine | Admitting: Family Medicine

## 2022-09-19 ENCOUNTER — Ambulatory Visit: Payer: Medicare Other

## 2022-09-19 DIAGNOSIS — R928 Other abnormal and inconclusive findings on diagnostic imaging of breast: Secondary | ICD-10-CM

## 2023-04-30 ENCOUNTER — Encounter: Payer: Medicare Other | Admitting: Nurse Practitioner

## 2023-08-17 ENCOUNTER — Other Ambulatory Visit: Payer: Self-pay | Admitting: Family Medicine

## 2023-08-17 DIAGNOSIS — Z1231 Encounter for screening mammogram for malignant neoplasm of breast: Secondary | ICD-10-CM

## 2023-09-11 ENCOUNTER — Ambulatory Visit
Admission: RE | Admit: 2023-09-11 | Discharge: 2023-09-11 | Disposition: A | Discharge status: Home / Self Care | Source: Ambulatory Visit | Attending: Family Medicine | Admitting: Family Medicine

## 2023-09-11 DIAGNOSIS — Z1231 Encounter for screening mammogram for malignant neoplasm of breast: Secondary | ICD-10-CM
